# Patient Record
Sex: Female | Born: 1960 | Race: White | Hispanic: No | Smoking: Former smoker
Health system: Southern US, Community
[De-identification: ages and names within clinical notes are randomized; demographics above are authoritative.]

## PROBLEM LIST (undated history)

## (undated) DIAGNOSIS — T7840XA Allergy, unspecified, initial encounter: Secondary | ICD-10-CM

## (undated) DIAGNOSIS — R011 Cardiac murmur, unspecified: Secondary | ICD-10-CM

## (undated) DIAGNOSIS — J479 Bronchiectasis, uncomplicated: Secondary | ICD-10-CM

## (undated) DIAGNOSIS — R079 Chest pain, unspecified: Secondary | ICD-10-CM

## (undated) DIAGNOSIS — M069 Rheumatoid arthritis, unspecified: Secondary | ICD-10-CM

## (undated) DIAGNOSIS — M810 Age-related osteoporosis without current pathological fracture: Secondary | ICD-10-CM

## (undated) DIAGNOSIS — F32A Depression, unspecified: Secondary | ICD-10-CM

## (undated) DIAGNOSIS — O24419 Gestational diabetes mellitus in pregnancy, unspecified control: Secondary | ICD-10-CM

## (undated) DIAGNOSIS — G4730 Sleep apnea, unspecified: Secondary | ICD-10-CM

## (undated) DIAGNOSIS — M549 Dorsalgia, unspecified: Secondary | ICD-10-CM

## (undated) DIAGNOSIS — F329 Major depressive disorder, single episode, unspecified: Secondary | ICD-10-CM

## (undated) DIAGNOSIS — K602 Anal fissure, unspecified: Secondary | ICD-10-CM

## (undated) DIAGNOSIS — J45909 Unspecified asthma, uncomplicated: Secondary | ICD-10-CM

## (undated) DIAGNOSIS — M2550 Pain in unspecified joint: Secondary | ICD-10-CM

## (undated) DIAGNOSIS — F419 Anxiety disorder, unspecified: Secondary | ICD-10-CM

## (undated) DIAGNOSIS — E785 Hyperlipidemia, unspecified: Secondary | ICD-10-CM

## (undated) DIAGNOSIS — E559 Vitamin D deficiency, unspecified: Secondary | ICD-10-CM

## (undated) DIAGNOSIS — M1990 Unspecified osteoarthritis, unspecified site: Secondary | ICD-10-CM

## (undated) DIAGNOSIS — M797 Fibromyalgia: Secondary | ICD-10-CM

## (undated) DIAGNOSIS — R002 Palpitations: Secondary | ICD-10-CM

## (undated) DIAGNOSIS — R0602 Shortness of breath: Secondary | ICD-10-CM

## (undated) DIAGNOSIS — R197 Diarrhea, unspecified: Secondary | ICD-10-CM

## (undated) DIAGNOSIS — A319 Mycobacterial infection, unspecified: Secondary | ICD-10-CM

## (undated) HISTORY — DX: Anxiety disorder, unspecified: F41.9

## (undated) HISTORY — DX: Depression, unspecified: F32.A

## (undated) HISTORY — DX: Diarrhea, unspecified: R19.7

## (undated) HISTORY — DX: Chest pain, unspecified: R07.9

## (undated) HISTORY — DX: Pain in unspecified joint: M25.50

## (undated) HISTORY — DX: Palpitations: R00.2

## (undated) HISTORY — DX: Shortness of breath: R06.02

## (undated) HISTORY — DX: Allergy, unspecified, initial encounter: T78.40XA

## (undated) HISTORY — DX: Major depressive disorder, single episode, unspecified: F32.9

## (undated) HISTORY — DX: Mycobacterial infection, unspecified: A31.9

## (undated) HISTORY — DX: Vitamin D deficiency, unspecified: E55.9

## (undated) HISTORY — DX: Rheumatoid arthritis, unspecified: M06.9

## (undated) HISTORY — DX: Sleep apnea, unspecified: G47.30

## (undated) HISTORY — DX: Fibromyalgia: M79.7

## (undated) HISTORY — DX: Dorsalgia, unspecified: M54.9

## (undated) HISTORY — PX: OTHER SURGICAL HISTORY: SHX169

## (undated) HISTORY — DX: Unspecified asthma, uncomplicated: J45.909

## (undated) HISTORY — DX: Cardiac murmur, unspecified: R01.1

## (undated) HISTORY — DX: Age-related osteoporosis without current pathological fracture: M81.0

## (undated) HISTORY — DX: Bronchiectasis, uncomplicated: J47.9

## (undated) HISTORY — DX: Gestational diabetes mellitus in pregnancy, unspecified control: O24.419

## (undated) HISTORY — DX: Unspecified osteoarthritis, unspecified site: M19.90

## (undated) HISTORY — PX: COLONOSCOPY: SHX174

## (undated) HISTORY — DX: Anal fissure, unspecified: K60.2

## (undated) HISTORY — DX: Hyperlipidemia, unspecified: E78.5

---

## 1998-03-31 ENCOUNTER — Ambulatory Visit (HOSPITAL_COMMUNITY): Admission: RE | Admit: 1998-03-31 | Discharge: 1998-03-31 | Payer: Self-pay | Admitting: Surgery

## 1998-09-06 HISTORY — PX: FOOT SURGERY: SHX648

## 1998-09-06 HISTORY — PX: OTHER SURGICAL HISTORY: SHX169

## 1998-11-05 ENCOUNTER — Other Ambulatory Visit: Admission: RE | Admit: 1998-11-05 | Discharge: 1998-11-05 | Payer: Self-pay | Admitting: Gynecology

## 2000-09-19 ENCOUNTER — Other Ambulatory Visit: Admission: RE | Admit: 2000-09-19 | Discharge: 2000-09-19 | Payer: Self-pay | Admitting: Gynecology

## 2000-10-19 ENCOUNTER — Encounter: Payer: Self-pay | Admitting: Gynecology

## 2000-10-19 ENCOUNTER — Ambulatory Visit (HOSPITAL_COMMUNITY): Admission: RE | Admit: 2000-10-19 | Discharge: 2000-10-19 | Payer: Self-pay | Admitting: Gynecology

## 2001-10-31 ENCOUNTER — Other Ambulatory Visit: Admission: RE | Admit: 2001-10-31 | Discharge: 2001-10-31 | Payer: Self-pay | Admitting: Gynecology

## 2003-01-09 ENCOUNTER — Other Ambulatory Visit: Admission: RE | Admit: 2003-01-09 | Discharge: 2003-01-09 | Payer: Self-pay | Admitting: Gynecology

## 2005-02-22 ENCOUNTER — Other Ambulatory Visit: Admission: RE | Admit: 2005-02-22 | Discharge: 2005-02-22 | Payer: Self-pay | Admitting: Gynecology

## 2005-05-18 ENCOUNTER — Ambulatory Visit (HOSPITAL_COMMUNITY): Admission: RE | Admit: 2005-05-18 | Discharge: 2005-05-18 | Payer: Self-pay | Admitting: Gynecology

## 2006-03-07 ENCOUNTER — Other Ambulatory Visit: Admission: RE | Admit: 2006-03-07 | Discharge: 2006-03-07 | Payer: Self-pay | Admitting: Gynecology

## 2006-09-06 HISTORY — PX: LUMBAR DISC SURGERY: SHX700

## 2006-09-18 ENCOUNTER — Encounter: Admission: RE | Admit: 2006-09-18 | Discharge: 2006-09-18 | Payer: Self-pay | Admitting: Rheumatology

## 2006-10-20 ENCOUNTER — Ambulatory Visit (HOSPITAL_COMMUNITY): Admission: RE | Admit: 2006-10-20 | Discharge: 2006-10-21 | Payer: Self-pay | Admitting: Neurosurgery

## 2007-11-13 ENCOUNTER — Other Ambulatory Visit: Admission: RE | Admit: 2007-11-13 | Discharge: 2007-11-13 | Payer: Self-pay | Admitting: Gynecology

## 2008-05-15 ENCOUNTER — Encounter: Admission: RE | Admit: 2008-05-15 | Discharge: 2008-05-15 | Payer: Self-pay | Admitting: Gynecology

## 2010-09-27 ENCOUNTER — Encounter: Payer: Self-pay | Admitting: Gynecology

## 2011-01-22 NOTE — Op Note (Signed)
NAMEEILENE, VOIGT                 ACCOUNT NO.:  [id number]   MEDICAL RECORD NO.:  [id number]          PATIENT TYPE:  AMB   LOCATION:  SDS                          FACILITY:  [facility]   PHYSICIAN:  Clydene Fake, M.D.  DATE OF BIRTH:  13-Aug-1961   DATE OF PROCEDURE:  10/20/2006  DATE OF DISCHARGE:                               OPERATIVE REPORT   PREOPERATIVE DIAGNOSIS:  Left L4-5 herniated nucleus pulposus and far-  lateral herniated nucleus pulposus.   POSTOPERATIVE DIAGNOSES:  Left L4-5 herniated nucleus pulposus and far-  lateral herniated nucleus pulposus.   PROCEDURE:  Left L4-5 semihemilaminectomy, diskectomy and far-lateral  diskectomy, microdissection with the microscope.   SURGEON:  Clydene Fake, M.D.   ASSISTANT:  Hewitt Shorts, M.D.   ANESTHESIA:  General endotracheal tube anesthesia.   ESTIMATED BLOOD LOSS:  Minimal.   BLOOD GIVEN:  None.   DRAINS:  None.   COMPLICATIONS:  None.   REASON FOR PROCEDURE:  The patient is a 50 year old woman, who for the  last 6 or 7 weeks has had pain from the left buttock down the left leg  to the ankle and top of the foot with numbness.  Steroid Dosepak did not  help much.  Diclofenac has not helped much.  Exam shows left EHL 3 to 5  in strength, dorsiflexion, and there was intact sensation, and it was  decreased in the left L5 and the left L4 distribution.  MRI was done,  showing a large disk herniation at L4-5 with a fragment going under the  5 root, and also a foraminal and far lateral component compressing the 4  root.  The patient was brought in for diskectomy.   PROCEDURE IN DETAIL:  The patient was brought to the operating room.  General anesthesia was induced.  The patient was placed in a prone  position on the Wilson frame with all pressure points padded.  The  patient was prepped and draped in sterile fashion.  The site of the  incision was injected with 10 mL of 1% lidocaine with epinephrine.  A  needle  was placed in the interspace.  X-ray was obtained, showing the  needle was pointed at the 4-5 interspace.  Incision was then made,  centered where the needle was.  Incision taken down to the fascia and  hemostasis was obtained with Bovie cauterization.  The fascia was  incised on the left side over the L4 and 5 spinous processes and  subperiosteal dissection was done over the spinous processes and the  laminae out to the facets.  Self-retaining retractor was placed.  Marker  was placed in the interspace.  Another x-ray was obtained, confirming  our positioning at L4-5.  The microscope was brought in for  microdissection, and at this point, a high-speed drill was used to start  a semihemilaminectomy at L4-5, removing the inferior part of L4, the  superior part of L5.  Medial facetectomy.  Kerrison punch was used to  continue the laminectomy and remove the ligamentum flavum, and a  foraminotomy was done over the L4  roots and we made our dissection was  up just above the disk space, cephalad.  We explored the epidural space.  There was a large free fragment of disk under the 5 root, and we removed  this, and there was a long, 3 cm or so, piece of disk that was up under  the 5 root, and when we removed that, we had good decompression of the 5  root.  We explored the annulus.  It had a broad disk bulging and a large  rent into the disk space.  The 4-5 disk space was then incised with a 15  blade and diskectomy continued with pituitary rongeurs and curets.  We  reached up in the far lateral space with curets and pushed disk material  back down into the disk space and removed it with Kerrison punches and  pituitary rongeurs to decompress the 4 root, and we did a far lateral  diskectomy through this root.  When we were finished, we had good  decompression of the thecal sac, the 5 and the L4 roots.  They went out  the foramina well without any compression.  We irrigated with antibiotic  solution.   Hemostasis was obtained with bipolar cauterization, Gelfoam  and thrombin.  The Gelfoam was irrigated out.  We irrigated with  antibiotic solution some more.  We had good hemostasis.  The retractors  were removed.  The fascia was closed with 0 Vicryl interrupted sutures.  Subcutaneous tissues were closed with 0, 2-0 and 3-0 Vicryl interrupted  sutures.  Skin was closed with benzoin and Steri-Strips.  Dressing was  placed.  The patient was placed back in supine position, awakened from  anesthesia and transferred to the recovery room in stable condition.           ______________________________  Clydene Fake, M.D.     JRH/MEDQ  D:  10/20/2006  T:  10/21/2006  Job:  [id number]

## 2016-10-22 ENCOUNTER — Ambulatory Visit (INDEPENDENT_AMBULATORY_CARE_PROVIDER_SITE_OTHER): Payer: BC Managed Care – PPO | Admitting: Family Medicine

## 2016-10-22 VITALS — BP 106/70 | HR 98 | Temp 99.3°F | Resp 16 | Ht 63.0 in | Wt 156.6 lb

## 2016-10-22 DIAGNOSIS — J329 Chronic sinusitis, unspecified: Secondary | ICD-10-CM

## 2016-10-22 DIAGNOSIS — J3089 Other allergic rhinitis: Secondary | ICD-10-CM

## 2016-10-22 DIAGNOSIS — R6889 Other general symptoms and signs: Secondary | ICD-10-CM | POA: Diagnosis not present

## 2016-10-22 LAB — POCT INFLUENZA A/B
Influenza A, POC: NEGATIVE
Influenza B, POC: NEGATIVE

## 2016-10-22 MED ORDER — IPRATROPIUM BROMIDE 0.03 % NA SOLN: 2.0000 | Freq: Two times a day (BID) | NASAL | 0 refills | Status: DC

## 2016-10-22 MED ORDER — AMOXICILLIN-POT CLAVULANATE 875-125 MG PO TABS: 1.0000 | ORAL_TABLET | Freq: Two times a day (BID) | ORAL | 0 refills | Status: DC

## 2016-10-22 MED ORDER — FLUCONAZOLE 150 MG PO TABS: 150.0000 mg | ORAL_TABLET | Freq: Once | ORAL | 0 refills | Status: AC

## 2016-10-22 MED ORDER — HYDROCOD POLST-CPM POLST ER 10-8 MG/5ML PO SUER
5.0000 mL | Freq: Two times a day (BID) | ORAL | 0 refills | Status: DC | PRN
Start: 2016-10-22 — End: 2018-01-13

## 2016-10-22 MED ORDER — ALBUTEROL SULFATE HFA 108 (90 BASE) MCG/ACT IN AERS: 2.0000 | INHALATION_SPRAY | Freq: Four times a day (QID) | RESPIRATORY_TRACT | 2 refills | Status: DC | PRN

## 2016-10-22 NOTE — Progress Notes (Signed)
Patient ID: Karina Wilkinson, female    DOB: June 03, 1961, 56 y.o.   MRN: [id number]  PCP: No PCP Per Patient  Chief Complaint  Patient presents with  . Nasal Congestion    Symptoms x 2 days  . Cough  . Chest Pain    w/ cough   . Sore Throat  . Dizziness    Subjective:  HPI  56 year old female presents for evaluation of nasal congestion, cough, chest tenderness with cough, dizziness, and sore throat x 2 days. Illness started with cough. Illness has since progressed to itching of bilateral ears and throat. Nasal drainage, headache with facial pain, with dizziness that started today. Reports general body aches. Unknown if recent exposure to influenza. Headache mostly generalized and radiate to her upper neck. Reports that she has chronic persistent outdoor allergies which she previously has been under the care of an allergist. She is new to the area and requests a referral to establish care with an allergist.   Social History   Social History  . Marital status: Legally Separated    Spouse name: N/A  . Number of children: N/A  . Years of education: N/A   Occupational History  . Not on file.   Social History Main Topics  . Smoking status: Never Smoker  . Smokeless tobacco: Never Used  . Alcohol use 1.8 oz/week    3 Glasses of wine per week  . Drug use: No  . Sexual activity: Not on file   Other Topics Concern  . Not on file   Social History Narrative  . No narrative on file    Family History  Problem Relation Age of Onset  . Hypertension Mother   . Hypertension Father   . Cancer Father   . Heart disease Father   . Hyperlipidemia Father   . Stroke Father   . Mental illness Maternal Grandmother   . Stroke Maternal Grandmother   . Hypertension Maternal Grandmother   . Cancer Paternal Grandmother   . Diabetes Paternal Grandmother    Review of Systems See HPI   Prior to Admission medications   Medication Sig Start Date End Date Taking? Authorizing Provider    albuterol (PROAIR HFA) 108 (90 Base) MCG/ACT inhaler Inhale into the lungs every 6 (six) hours as needed for wheezing or shortness of breath.   Yes Historical Provider, MD  busPIRone (BUSPAR) 10 MG tablet Take 10 mg by mouth 3 (three) times daily.   Yes Historical Provider, MD  citalopram (CELEXA) 20 MG tablet Take 60 mg by mouth daily.   Yes Historical Provider, MD  Liraglutide -Weight Management (SAXENDA) 18 MG/3ML SOPN Inject into the skin.   Yes Historical Provider, MD    Past Medical, Surgical Family and Social History reviewed and updated.    Objective:   Today's Vitals   10/22/16 1027  BP: 106/70  Pulse: 98  Resp: 16  Temp: 99.3 F (37.4 C)  TempSrc: Oral  SpO2: 100%  Weight: 156 lb 9.6 oz (71 kg)  Height: 5\' 3"  (1.6 m)    Wt Readings from Last 3 Encounters:  10/22/16 156 lb 9.6 oz (71 kg)   Physical Exam  Constitutional: She is oriented to person, place, and time. She appears well-developed and well-nourished. She has a sickly appearance.  HENT:  Head: Normocephalic and atraumatic.  Right Ear: A middle ear effusion is present.  Left Ear: A middle ear effusion is present.  Nose: Mucosal edema and rhinorrhea present.  Mouth/Throat:  Oropharynx is clear and moist and mucous membranes are normal.  Eyes: Conjunctivae are normal. Pupils are equal, round, and reactive to light.  Neck: Normal range of motion. Neck supple.  Cardiovascular: Normal rate, regular rhythm, normal heart sounds and intact distal pulses.   Pulmonary/Chest: Effort normal. She has no wheezes. She exhibits tenderness.  Musculoskeletal: Normal range of motion.  Lymphadenopathy:    She has no cervical adenopathy.  Neurological: She is alert and oriented to person, place, and time.  Skin: Skin is warm and dry.  Psychiatric: She has a normal mood and affect. Her behavior is normal. Judgment and thought content normal.      Assessment & Plan:  1. Flu-like symptoms 2. Sinusitis, unspecified  chronic 3. Environmental and seasonal allergies  I have placed a referral to for you to be evaluated by an allergist.  For cough, chlorpheniramine-hydrocodone (Tussionex) 5 ml every 12 hours.  Medication causes drowsiness and or sedation.   Start Augmentin 1 tablet twice daily with food to avoid stomach upset x 10 days. Complete all medication.   Ipratropium (Atrovent) 2 sprays, twice daily as needed for nasal congestion.   Carroll Sage. Kenton Kingfisher, MSN, FNP-C [facility]

## 2016-10-22 NOTE — Patient Instructions (Addendum)
-I have placed a referral to for you to be evaluated by an allergist.  For cough, For cough, chlorpheniramine-hydrocodone (Tussionex) 5 ml every 12 hours. Medication causes drowsiness and or sedation.   Start Augmentin 1 tablet twice daily with food to avoid stomach upset x 10 days. Complete all medication.   Ipratropium (Atrovent) 2 sprays, twice daily as needed for nasal congestion.   IF you received an x-ray today, you will receive an invoice from [facility]. Please contact [facility] at [phone] with questions or concerns regarding your invoice.   IF you received labwork today, you will receive an invoice from [place]. Please contact [facility] at [phone] with questions or concerns regarding your invoice.   Our billing staff will not be able to assist you with questions regarding bills from these companies.  You will be contacted with the lab results as soon as they are available. The fastest way to get your results is to activate your My Chart account. Instructions are located on the last page of this paperwork. If you have not heard from [place] regarding the results in 2 weeks, please contact this office.      Sinusitis, Adult Sinusitis is soreness and inflammation of your sinuses. Sinuses are hollow spaces in the bones around your face. They are located:  Around your eyes.  In the middle of your forehead.  Behind your nose.  In your cheekbones. Your sinuses and nasal passages are lined with a stringy fluid (mucus). Mucus normally drains out of your sinuses. When your nasal tissues get inflamed or swollen, the mucus can get trapped or blocked so air cannot flow through your sinuses. This lets bacteria, viruses, and funguses grow, and that leads to infection. Follow these instructions at home: Medicines  Take, use, or apply over-the-counter and prescription medicines only as told by your doctor. These may include nasal sprays.  If you were prescribed  an antibiotic medicine, take it as told by your doctor. Do not stop taking the antibiotic even if you start to feel better. Hydrate and Humidify  Drink enough water to keep your pee (urine) clear or pale yellow.  Use a cool mist humidifier to keep the humidity level in your home above 50%.  Breathe in steam for 10-15 minutes, 3-4 times a day or as told by your doctor. You can do this in the bathroom while a hot shower is running.  Try not to spend time in cool or dry air. Rest  Rest as much as possible.  Sleep with your head raised (elevated).  Make sure to get enough sleep each night. General instructions  Put a warm, moist washcloth on your face 3-4 times a day or as told by your doctor. This will help with discomfort.  Wash your hands often with soap and water. If there is no soap and water, use hand sanitizer.  Do not smoke. Avoid being around people who are smoking (secondhand smoke).  Keep all follow-up visits as told by your doctor. This is important. Contact a doctor if:  You have a fever.  Your symptoms get worse.  Your symptoms do not get better within 10 days. Get help right away if:  You have a very bad headache.  You cannot stop throwing up (vomiting).  You have pain or swelling around your face or eyes.  You have trouble seeing.  You feel confused.  Your neck is stiff.  You have trouble breathing. This information is not intended to replace advice given to  you by your health care provider. Make sure you discuss any questions you have with your health care provider. Document Released: 02/09/2008 Document Revised: 04/18/2016 Document Reviewed: 06/18/2015 Elsevier Interactive Patient Education  2017 [employer].

## 2017-12-07 ENCOUNTER — Encounter: Payer: Self-pay | Admitting: Internal Medicine

## 2018-01-12 ENCOUNTER — Ambulatory Visit: Payer: Self-pay | Admitting: Internal Medicine

## 2018-01-13 ENCOUNTER — Ambulatory Visit: Payer: BC Managed Care – PPO | Admitting: Gastroenterology

## 2018-01-13 ENCOUNTER — Encounter: Payer: Self-pay | Admitting: Gastroenterology

## 2018-01-13 ENCOUNTER — Telehealth: Payer: Self-pay

## 2018-01-13 ENCOUNTER — Encounter

## 2018-01-13 ENCOUNTER — Other Ambulatory Visit (INDEPENDENT_AMBULATORY_CARE_PROVIDER_SITE_OTHER): Payer: BC Managed Care – PPO

## 2018-01-13 VITALS — BP 106/74 | HR 76 | Ht 62.25 in | Wt 158.0 lb

## 2018-01-13 DIAGNOSIS — K529 Noninfective gastroenteritis and colitis, unspecified: Secondary | ICD-10-CM

## 2018-01-13 DIAGNOSIS — R159 Full incontinence of feces: Secondary | ICD-10-CM | POA: Diagnosis not present

## 2018-01-13 DIAGNOSIS — Z1211 Encounter for screening for malignant neoplasm of colon: Secondary | ICD-10-CM | POA: Diagnosis not present

## 2018-01-13 LAB — TSH: TSH: 1.02 u[IU]/mL (ref 0.35–4.50)

## 2018-01-13 LAB — IGA: IGA: 269 mg/dL (ref 68–378)

## 2018-01-13 MED ORDER — PEG-KCL-NACL-NASULF-NA ASC-C 140 G PO SOLR: 1.0000 | Freq: Once | ORAL | 0 refills | Status: AC

## 2018-01-13 NOTE — Progress Notes (Signed)
HPI :  57 y/o female with a history of arthritis, hyperlipidemia, anxiety, self referred here for a new patient visit to discuss diarrhea / changes in bowels.  She reports she is had altered bowel habits intermittently over the past few years. On a good day she will have 3-4 mostly formed bowel movements per day. On a bad day she can have upwards of 10 loose and watery stools per day with urgency. She has been suffering from occasional incontinence during the night and both during the day. She has such strong urgency she has a hard time getting to the bathroom in time. She denies any blood in her stools at this time. She has a history of remote anal fissure. She denies much of any significant routine abdominal pains that bother her. She denies any reflux or dyspepsia. No dysphagia. No nausea or vomiting, she is otherwise eating well. She denies any routine NSAID use. She denies any new medications. She has a daughter who has a diagnosis of ulcerative colitis. Her sister has irritable bowel syndrome. She takes Imodium as needed for symptoms which does seem to help slow her down. She's never had a prior colonoscopy. She denies any family history of colon cancer.   Past Medical History:  Diagnosis Date  . Allergy   . Anal fissure   . Anxiety   . Arthritis   . Asthma   . Depression   . Fibromyalgia   . Gestational diabetes   . Heart murmur   . HLD (hyperlipidemia)      Past Surgical History:  Procedure Laterality Date  . FOOT SURGERY Right 2000  . low back surgery    . [place] SURGERY  2008  . plantar fasciitis surgery     Family History  Problem Relation Age of Onset  . Hypertension Mother   . Peptic Ulcer Mother   . Hypertension Father   . Cancer Father   . Heart disease Father   . Hyperlipidemia Father   . Stroke Father   . Mental illness Maternal Grandmother   . Stroke Maternal Grandmother   . Hypertension Maternal Grandmother   . Diabetes Paternal Grandmother   .  Ovarian cancer Paternal Grandmother   . Irritable bowel syndrome Sister   . Ovarian cancer Paternal Aunt   . Ulcerative colitis Daughter    Social History   Tobacco Use  . Smoking status: Former Smoker    Types: Cigarettes    Last attempt to quit: 1985    Years since quitting: 34.3  . Smokeless tobacco: Never Used  Substance Use Topics  . Alcohol use: Yes    Alcohol/week: 1.8 oz    Types: 3 Glasses of wine per week    Comment: 0-2 per day  . Drug use: No   Current Outpatient Medications  Medication Sig Dispense Refill  . busPIRone (BUSPAR) 10 MG tablet Take 10 mg by mouth 3 (three) times daily.    . citalopram (CELEXA) 20 MG tablet Take 60 mg by mouth daily.    . cyclobenzaprine (FLEXERIL) 10 MG tablet Take 10 mg by mouth as needed for muscle spasms.    . fluticasone (FLONASE) 50 MCG/ACT nasal spray Place 1 spray into both nostrils daily.    . rosuvastatin (CRESTOR) 10 MG tablet Take 10 mg by mouth daily.    . Vitamin D, Ergocalciferol, (DRISDOL) 50000 units CAPS capsule Take 50,000 Units by mouth every 7 (seven) days.    . Liraglutide -Weight Management (SAXENDA) 18  MG/3ML SOPN Inject into the skin.    Marland Kitchen PEG-KCl-NaCl-NaSulf-Na Asc-C (PLENVU) 140 g SOLR Take 1 kit by mouth once for 1 dose. 1 each 0   No current facility-administered medications for this visit.    No Known Allergies   Review of Systems: All systems reviewed and negative except where noted in HPI.   No labs available in Epic  Physical Exam: BP 106/74 (BP Location: Left Arm, Patient Position: Sitting, Cuff Size: Normal)   Pulse 76   Ht 5' 2.25" (1.581 m) Comment: height measured without shoes  Wt 158 lb (71.7 kg)   BMI 28.67 kg/m  Constitutional: Pleasant,well-developed, female in no acute distress. HEENT: Normocephalic and atraumatic. Conjunctivae are normal. No scleral icterus. Neck supple.  Cardiovascular: Normal rate, regular rhythm.  Pulmonary/chest: Effort normal and breath sounds normal. No  wheezing, rales or rhonchi. Abdominal: Soft, nondistended, nontender.  There are no masses palpable. No hepatomegaly. Extremities: no edema Lymphadenopathy: No cervical adenopathy noted. Neurological: Alert and oriented to person place and time. Skin: Skin is warm and dry. No rashes noted. Psychiatric: Normal mood and affect. Behavior is normal.   ASSESSMENT AND PLAN: 57 year old female with medical history as outlined above, presenting with symptoms of chronic diarrhea associated with fecal incontinence. She's not had a prior colonoscopy. Her daughter has ulcerative colitis  We discussed potential causes for her symptoms. Given the chronicity of her symptoms infectious etiologies seem much less likely. As she's never had a colonoscopy before, I'm recommending this to be done not only for colon cancer screening purposes but to ensure no evidence of inflammatory bowel disease or microscopic colitis. I discussed the risks and benefits of colonoscopy with the patient and she wished to proceed. In the interim we will obtain her baseline labs from her primary care physician to ensure okay. I will also send labs today to include TSH and celiac serologies to ensure normal. In the interim recommend that she use Imodium on a scheduled basis, such as taking an Imodium prior to bedtime in attempt to prevent nocturnal symptoms, and then taking again in the morning and titrate up as needed. She agreed with this plan. Further recommendations pending the results.  Menominee Cellar, MD [facility]

## 2018-01-13 NOTE — Telephone Encounter (Signed)
Called pt and LM that she does not need to hold Saxenda daily injections (will be using for weight loss starting sometime soon) for more than one day prior to colonoscopy procedure (per Nicoletta Ba, PA). This medication contains liraglutide, the same ingredient in Victoza used to treat diabetes. She has been placed on the wait list for a sooner date for her procedure.

## 2018-01-13 NOTE — Patient Instructions (Addendum)
If you are age 59 or older, your body mass index should be between 23-30. Your Body mass index is 28.67 kg/m. If this is out of the aforementioned range listed, please consider follow up with your Primary Care Provider.  If you are age 42 or younger, your body mass index should be between 19-25. Your Body mass index is 28.67 kg/m. If this is out of the aformentioned range listed, please consider follow up with your Primary Care Provider.   You have been scheduled for a colonoscopy. Please follow written instructions given to you at your visit today.  Please pick up your prep supplies at the pharmacy within the next 1-3 days. If you use inhalers (even only as needed), please bring them with you on the day of your procedure. Your physician has requested that you go to www.startemmi.com and enter the access code given to you at your visit today. This web site gives a general overview about your procedure. However, you should still follow specific instructions given to you by our office regarding your preparation for the procedure.  Please go to the lab in the basement of our building to have lab work done as you leave today.  Please purchase the following medications over the counter and take as directed: Imodium, take at bedtime as needed  We will request your labs from your Primary Care doctor.  Thank you for entrusting me with your care and for choosing [facility], Dr. Wilton Cellar

## 2018-01-16 LAB — TISSUE TRANSGLUTAMINASE, IGA: (tTG) Ab, IgA: 1 U/mL

## 2018-01-24 ENCOUNTER — Telehealth: Payer: Self-pay | Admitting: Gastroenterology

## 2018-01-24 NOTE — Telephone Encounter (Signed)
Lab results from primary care arrived.  11/04/2017: Normal LFts, BUN 15, Cr 0.77, electrolytes normal  No CBC on file.  Will await colonoscopy.

## 2018-01-30 ENCOUNTER — Encounter: Payer: Self-pay | Admitting: Gastroenterology

## 2018-01-31 ENCOUNTER — Telehealth: Payer: Self-pay | Admitting: Gastroenterology

## 2018-01-31 NOTE — Telephone Encounter (Signed)
PLs disregard this message. Pt called back stating that she already gave pharmacy coupon but she did not remember it.

## 2018-01-31 NOTE — Telephone Encounter (Signed)
Universal Codes sent to [facility] pharmacy for Plenvu.

## 2018-02-07 ENCOUNTER — Other Ambulatory Visit: Payer: Self-pay

## 2018-02-07 ENCOUNTER — Ambulatory Visit (AMBULATORY_SURGERY_CENTER): Payer: BC Managed Care – PPO | Admitting: Gastroenterology

## 2018-02-07 ENCOUNTER — Encounter: Payer: Self-pay | Admitting: Gastroenterology

## 2018-02-07 VITALS — BP 107/62 | HR 57 | Temp 98.2°F | Resp 10 | Ht 62.25 in | Wt 158.0 lb

## 2018-02-07 DIAGNOSIS — D120 Benign neoplasm of cecum: Secondary | ICD-10-CM

## 2018-02-07 DIAGNOSIS — K529 Noninfective gastroenteritis and colitis, unspecified: Secondary | ICD-10-CM

## 2018-02-07 DIAGNOSIS — D126 Benign neoplasm of colon, unspecified: Secondary | ICD-10-CM | POA: Diagnosis not present

## 2018-02-07 DIAGNOSIS — D123 Benign neoplasm of transverse colon: Secondary | ICD-10-CM

## 2018-02-07 MED ORDER — SODIUM CHLORIDE 0.9 % IV SOLN: 500.0000 mL | Freq: Once | INTRAVENOUS | Status: DC

## 2018-02-07 NOTE — Op Note (Signed)
[facility] Patient Name: Karina Wilkinson Procedure Date: 02/07/2018 9:43 AM MRN: [id number] Endoscopist: Remo Lipps P. Havery Moros , MD Age: 57 Referring MD:  Date of Birth: Aug 16, 1961 Gender: Female Account #: [id number] Procedure:                Colonoscopy Indications:              This is the patient's first colonoscopy, Chronic                            diarrhea Medicines:                Monitored Anesthesia Care Procedure:                Pre-Anesthesia Assessment:                           - Prior to the procedure, a History and Physical                            was performed, and patient medications and                            allergies were reviewed. The patient's tolerance of                            previous anesthesia was also reviewed. The risks                            and benefits of the procedure and the sedation                            options and risks were discussed with the patient.                            All questions were answered, and informed consent                            was obtained. Prior Anticoagulants: The patient has                            taken no previous anticoagulant or antiplatelet                            agents. ASA Grade Assessment: II - A patient with                            mild systemic disease. After reviewing the risks                            and benefits, the patient was deemed in                            satisfactory condition to undergo the procedure.  After obtaining informed consent, the colonoscope                            was passed under direct vision. Throughout the                            procedure, the patient's blood pressure, pulse, and                            oxygen saturations were monitored continuously. The                            Colonoscope was introduced through the anus and                            advanced to the the terminal ileum, with                     identification of the appendiceal orifice and IC                            valve. The colonoscopy was performed without                            difficulty. The patient tolerated the procedure                            well. The quality of the bowel preparation was                            good. The terminal ileum, ileocecal valve,                            appendiceal orifice, and rectum were photographed. Scope In: 3:08:65 AM Scope Out: 10:20:39 AM Scope Withdrawal Time: 0 hours 19 minutes 24 seconds  Total Procedure Duration: 0 hours 24 minutes 25 seconds  Findings:                 The perianal and digital rectal examinations were                            normal.                           The terminal ileum appeared normal.                           A 5 to 6 mm polyp was found in the cecum. The polyp                            was sessile. The polyp was removed with a cold                            snare. Resection and retrieval were complete.  A 5 mm polyp was found in the hepatic flexure. The                            polyp was sessile. The polyp was removed with a                            cold snare. Resection and retrieval were complete.                           The colon was tortuous.                           Internal hemorrhoids were found during retroflexion.                           The exam was otherwise without abnormality.                           Biopsies for histology were taken with a cold                            forceps from the right colon, left colon and                            transverse colon for evaluation of microscopic                            colitis. Complications:            No immediate complications. Estimated blood loss:                            Minimal. Estimated Blood Loss:     Estimated blood loss was minimal. Impression:               - The examined portion of the ileum was normal.                            - One 5 to 6 mm polyp in the cecum, removed with a                            cold snare. Resected and retrieved.                           - One 5 mm polyp at the hepatic flexure, removed                            with a cold snare. Resected and retrieved.                           - Tortuous colon.                           - Internal hemorrhoids.                           -  The examination was otherwise normal.                           - Biopsies were taken with a cold forceps from the                            right colon, left colon and transverse colon for                            evaluation of microscopic colitis. Recommendation:           - Patient has a contact number available for                            emergencies. The signs and symptoms of potential                            delayed complications were discussed with the                            patient. Return to normal activities tomorrow.                            Written discharge instructions were provided to the                            patient.                           - Resume previous diet.                           - Continue present medications.                           - Await pathology results.                           - Repeat colonoscopy is recommended for                            surveillance. The colonoscopy date will be                            determined after pathology results from today's                            exam become available for review. Remo Lipps P. Armbruster, MD 02/07/2018 10:25:34 AM This report has been signed electronically.

## 2018-02-07 NOTE — Progress Notes (Signed)
Spontaneous respirations throughout. VSS. Resting comfortably. To PACU on room air. Report to  RN. 

## 2018-02-07 NOTE — Progress Notes (Signed)
Called to room to assist during endoscopic procedure.  Patient ID and intended procedure confirmed with present staff. Received instructions for my participation in the procedure from the performing physician.  

## 2018-02-07 NOTE — Patient Instructions (Signed)
YOU HAD AN ENDOSCOPIC PROCEDURE TODAY AT THE [facility]:   Refer to the procedure report that was given to you for any specific questions about what was found during the examination.  If the procedure report does not answer your questions, please call your gastroenterologist to clarify.  If you requested that your care partner not be given the details of your procedure findings, then the procedure report has been included in a sealed envelope for you to review at your convenience later.  YOU SHOULD EXPECT: Some feelings of bloating in the abdomen. Passage of more gas than usual.  Walking can help get rid of the air that was put into your GI tract during the procedure and reduce the bloating. If you had a lower endoscopy (such as a colonoscopy or flexible sigmoidoscopy) you may notice spotting of blood in your stool or on the toilet paper. If you underwent a bowel prep for your procedure, you may not have a normal bowel movement for a few days.  Please Note:  You might notice some irritation and congestion in your nose or some drainage.  This is from the oxygen used during your procedure.  There is no need for concern and it should clear up in a day or so.  SYMPTOMS TO REPORT IMMEDIATELY:   Following lower endoscopy (colonoscopy or flexible sigmoidoscopy):  Excessive amounts of blood in the stool  Significant tenderness or worsening of abdominal pains  Swelling of the abdomen that is new, acute  Fever of 100F or higher  Please see handouts given to you on Polyps and Hemorrhoids.  For urgent or emergent issues, a gastroenterologist can be reached at any hour by calling [phone].   DIET:  We do recommend a small meal at first, but then you may proceed to your regular diet.  Drink plenty of fluids but you should avoid alcoholic beverages for 24 hours.  ACTIVITY:  You should plan to take it easy for the rest of today and you should NOT DRIVE or use heavy machinery until tomorrow  (because of the sedation medicines used during the test).    FOLLOW UP: Our staff will call the number listed on your records the next business day following your procedure to check on you and address any questions or concerns that you may have regarding the information given to you following your procedure. If we do not reach you, we will leave a message.  However, if you are feeling well and you are not experiencing any problems, there is no need to return our call.  We will assume that you have returned to your regular daily activities without incident.  If any biopsies were taken you will be contacted by phone or by letter within the next 1-3 weeks.  Please call us at [phone] if you have not heard about the biopsies in 3 weeks.    SIGNATURES/CONFIDENTIALITY: You and/or your care partner have signed paperwork which will be entered into your electronic medical record.  These signatures attest to the fact that that the information above on your After Visit Summary has been reviewed and is understood.  Full responsibility of the confidentiality of this discharge information lies with you and/or your care-partner.  Thank you for letting us take care of your healthcare needs today. 

## 2018-02-08 ENCOUNTER — Telehealth: Payer: Self-pay

## 2018-02-08 NOTE — Telephone Encounter (Signed)
  Follow up Call-  Call back number 02/07/2018  Post procedure Call Back phone  # [phone]  Permission to leave phone message Yes  Some recent data might be hidden     Patient questions:  Do you have a fever, pain , or abdominal swelling? No. Pain Score  0 *  Have you tolerated food without any problems? Yes.    Have you been able to return to your normal activities? Yes.    Do you have any questions about your discharge instructions: Diet   No. Medications  No. Follow up visit  No.  Do you have questions or concerns about your Care? No.  Actions: * If pain score is 4 or above: No action needed, pain <4.

## 2018-03-03 ENCOUNTER — Encounter

## 2018-03-08 ENCOUNTER — Encounter: Payer: BC Managed Care – PPO | Admitting: Gastroenterology

## 2018-04-11 ENCOUNTER — Encounter: Payer: Self-pay | Admitting: Gastroenterology

## 2019-11-10 ENCOUNTER — Ambulatory Visit: Payer: BC Managed Care – PPO | Attending: Internal Medicine

## 2019-11-10 DIAGNOSIS — Z23 Encounter for immunization: Secondary | ICD-10-CM | POA: Insufficient documentation

## 2019-11-10 NOTE — Progress Notes (Signed)
   Covid-19 Vaccination Clinic  Name:  Karina Wilkinson    MRN: [id number] DOB: 1961-06-20  11/10/2019  [name] was observed post Covid-19 immunization for 15 minutes without incident. She was provided with Vaccine Information Sheet and instruction to access the V-Safe system.   [name] was instructed to call 911 with any severe reactions post vaccine: Marland Kitchen Difficulty breathing  . Swelling of face and throat  . A fast heartbeat  . A bad rash all over body  . Dizziness and weakness   Immunizations Administered    Name Date Dose VIS Date Route   Pfizer COVID-19 Vaccine 11/10/2019  8:54 AM 0.3 mL 08/17/2019 Intramuscular   Manufacturer: [place]   Lot: [id number]   [place]: [id number]

## 2019-12-01 ENCOUNTER — Ambulatory Visit: Payer: BC Managed Care – PPO | Attending: Internal Medicine

## 2019-12-01 DIAGNOSIS — Z23 Encounter for immunization: Secondary | ICD-10-CM

## 2019-12-01 NOTE — Progress Notes (Signed)
   Covid-19 Vaccination Clinic  Name:  Karina Wilkinson    MRN: [id number] DOB: 04/28/1961  12/01/2019  Karina Wilkinson was observed post Covid-19 immunization for 15 minutes without incident. She was provided with Vaccine Information Sheet and instruction to access the V-Safe system.   Karina Wilkinson was instructed to call 911 with any severe reactions post vaccine: Marland Kitchen Difficulty breathing  . Swelling of face and throat  . A fast heartbeat  . A bad rash all over body  . Dizziness and weakness   Immunizations Administered    Name Date Dose VIS Date Route   Pfizer COVID-19 Vaccine 12/01/2019  8:50 AM 0.3 mL 08/17/2019 Intramuscular   Manufacturer: [place]   Lot: [id number]   [place]: [id number]

## 2019-12-03 ENCOUNTER — Ambulatory Visit: Payer: BC Managed Care – PPO

## 2019-12-11 ENCOUNTER — Ambulatory Visit: Payer: BC Managed Care – PPO

## 2020-06-13 ENCOUNTER — Ambulatory Visit: Payer: BC Managed Care – PPO | Attending: Internal Medicine

## 2020-06-13 DIAGNOSIS — Z23 Encounter for immunization: Secondary | ICD-10-CM

## 2020-06-13 NOTE — Progress Notes (Signed)
   Covid-19 Vaccination Clinic  Name:  Karina Wilkinson    MRN: [id number] DOB: July 27, 1961  06/13/2020  [name] was observed post Covid-19 immunization for 15 minutes without incident. She was provided with Vaccine Information Sheet and instruction to access the V-Safe system.   [name] was instructed to call 911 with any severe reactions post vaccine: Marland Kitchen Difficulty breathing  . Swelling of face and throat  . A fast heartbeat  . A bad rash all over body  . Dizziness and weakness

## 2021-01-22 ENCOUNTER — Other Ambulatory Visit (HOSPITAL_BASED_OUTPATIENT_CLINIC_OR_DEPARTMENT_OTHER): Payer: Self-pay

## 2021-01-22 ENCOUNTER — Other Ambulatory Visit: Payer: Self-pay

## 2021-01-22 ENCOUNTER — Ambulatory Visit: Payer: Self-pay | Attending: Internal Medicine

## 2021-01-22 DIAGNOSIS — Z23 Encounter for immunization: Secondary | ICD-10-CM

## 2021-01-22 MED ORDER — PFIZER-BIONT COVID-19 VAC-TRIS 30 MCG/0.3ML IM SUSP
INTRAMUSCULAR | 0 refills | Status: DC
  Filled 2021-01-22: qty 0.3, 1d supply, fill #0

## 2021-01-22 NOTE — Progress Notes (Signed)
   Covid-19 Vaccination Clinic  Name:  Karina Wilkinson    MRN: [id number] DOB: April 17, 1961  01/22/2021  [name] was observed post Covid-19 immunization for 15 minutes without incident. She was provided with Vaccine Information Sheet and instruction to access the V-Safe system.   [name] was instructed to call 911 with any severe reactions post vaccine: Marland Kitchen Difficulty breathing  . Swelling of face and throat  . A fast heartbeat  . A bad rash all over body  . Dizziness and weakness   Immunizations Administered    Name Date Dose VIS Date Route   PFIZER Comrnaty([place] TOP) Covid-19 Vaccine 01/22/2021 10:57 AM 0.3 mL 08/14/2020 Intramuscular   Manufacturer: [place]   Lot: [id number]   [facility]: [phone]

## 2021-02-17 ENCOUNTER — Other Ambulatory Visit: Payer: Self-pay | Admitting: Rheumatology

## 2021-02-17 DIAGNOSIS — M25561 Pain in right knee: Secondary | ICD-10-CM

## 2021-02-19 ENCOUNTER — Ambulatory Visit
Admission: RE | Admit: 2021-02-19 | Discharge: 2021-02-19 | Disposition: A | Discharge status: Home / Self Care | Payer: BC Managed Care – PPO | Source: Ambulatory Visit | Attending: Rheumatology | Admitting: Rheumatology

## 2021-02-19 DIAGNOSIS — M25561 Pain in right knee: Secondary | ICD-10-CM

## 2021-02-25 ENCOUNTER — Other Ambulatory Visit (HOSPITAL_COMMUNITY): Payer: Self-pay

## 2021-03-02 ENCOUNTER — Other Ambulatory Visit (HOSPITAL_COMMUNITY): Payer: Self-pay

## 2021-10-29 ENCOUNTER — Other Ambulatory Visit: Payer: Self-pay

## 2021-10-29 ENCOUNTER — Encounter (HOSPITAL_BASED_OUTPATIENT_CLINIC_OR_DEPARTMENT_OTHER): Payer: Self-pay

## 2021-10-29 ENCOUNTER — Inpatient Hospital Stay (HOSPITAL_BASED_OUTPATIENT_CLINIC_OR_DEPARTMENT_OTHER)
Admission: EM | Admit: 2021-10-29 | Discharge: 2021-11-01 | DRG: 194 | Disposition: A | Discharge status: Home / Self Care | Payer: BC Managed Care – PPO | Attending: Internal Medicine | Admitting: Internal Medicine

## 2021-10-29 ENCOUNTER — Emergency Department (HOSPITAL_BASED_OUTPATIENT_CLINIC_OR_DEPARTMENT_OTHER): Payer: BC Managed Care – PPO | Admitting: Radiology

## 2021-10-29 ENCOUNTER — Emergency Department (HOSPITAL_BASED_OUTPATIENT_CLINIC_OR_DEPARTMENT_OTHER): Payer: BC Managed Care – PPO

## 2021-10-29 DIAGNOSIS — J9601 Acute respiratory failure with hypoxia: Secondary | ICD-10-CM

## 2021-10-29 DIAGNOSIS — Z8041 Family history of malignant neoplasm of ovary: Secondary | ICD-10-CM | POA: Diagnosis not present

## 2021-10-29 DIAGNOSIS — Z87891 Personal history of nicotine dependence: Secondary | ICD-10-CM | POA: Diagnosis not present

## 2021-10-29 DIAGNOSIS — R0609 Other forms of dyspnea: Secondary | ICD-10-CM | POA: Diagnosis not present

## 2021-10-29 DIAGNOSIS — Z833 Family history of diabetes mellitus: Secondary | ICD-10-CM | POA: Diagnosis not present

## 2021-10-29 DIAGNOSIS — R32 Unspecified urinary incontinence: Secondary | ICD-10-CM | POA: Diagnosis present

## 2021-10-29 DIAGNOSIS — E785 Hyperlipidemia, unspecified: Secondary | ICD-10-CM | POA: Diagnosis present

## 2021-10-29 DIAGNOSIS — J45909 Unspecified asthma, uncomplicated: Secondary | ICD-10-CM | POA: Diagnosis present

## 2021-10-29 DIAGNOSIS — E876 Hypokalemia: Secondary | ICD-10-CM | POA: Diagnosis present

## 2021-10-29 DIAGNOSIS — R0902 Hypoxemia: Secondary | ICD-10-CM

## 2021-10-29 DIAGNOSIS — E663 Overweight: Secondary | ICD-10-CM | POA: Diagnosis present

## 2021-10-29 DIAGNOSIS — R9431 Abnormal electrocardiogram [ECG] [EKG]: Secondary | ICD-10-CM | POA: Diagnosis present

## 2021-10-29 DIAGNOSIS — Z83438 Family history of other disorder of lipoprotein metabolism and other lipidemia: Secondary | ICD-10-CM

## 2021-10-29 DIAGNOSIS — J188 Other pneumonia, unspecified organism: Secondary | ICD-10-CM | POA: Diagnosis present

## 2021-10-29 DIAGNOSIS — R042 Hemoptysis: Secondary | ICD-10-CM | POA: Diagnosis present

## 2021-10-29 DIAGNOSIS — M797 Fibromyalgia: Secondary | ICD-10-CM | POA: Diagnosis present

## 2021-10-29 DIAGNOSIS — Z7952 Long term (current) use of systemic steroids: Secondary | ICD-10-CM | POA: Diagnosis not present

## 2021-10-29 DIAGNOSIS — Z823 Family history of stroke: Secondary | ICD-10-CM | POA: Diagnosis not present

## 2021-10-29 DIAGNOSIS — F419 Anxiety disorder, unspecified: Secondary | ICD-10-CM | POA: Diagnosis present

## 2021-10-29 DIAGNOSIS — Z8616 Personal history of COVID-19: Secondary | ICD-10-CM | POA: Diagnosis not present

## 2021-10-29 DIAGNOSIS — D72828 Other elevated white blood cell count: Secondary | ICD-10-CM | POA: Diagnosis present

## 2021-10-29 DIAGNOSIS — Z79899 Other long term (current) drug therapy: Secondary | ICD-10-CM

## 2021-10-29 DIAGNOSIS — Z6829 Body mass index (BMI) 29.0-29.9, adult: Secondary | ICD-10-CM

## 2021-10-29 DIAGNOSIS — F32A Depression, unspecified: Secondary | ICD-10-CM | POA: Diagnosis present

## 2021-10-29 DIAGNOSIS — J189 Pneumonia, unspecified organism: Principal | ICD-10-CM | POA: Diagnosis present

## 2021-10-29 DIAGNOSIS — Z8249 Family history of ischemic heart disease and other diseases of the circulatory system: Secondary | ICD-10-CM

## 2021-10-29 HISTORY — DX: Overweight: E66.3

## 2021-10-29 LAB — BASIC METABOLIC PANEL
Anion gap: 12 (ref 5–15)
BUN: 11 mg/dL (ref 8–23)
CO2: 27 mmol/L (ref 22–32)
Calcium: 9 mg/dL (ref 8.9–10.3)
Chloride: 105 mmol/L (ref 98–111)
Creatinine, Ser: 0.71 mg/dL (ref 0.44–1.00)
GFR, Estimated: >60 mL/min (ref >60)
Glucose, Bld: 84 mg/dL (ref 70–99)
Potassium: 3.1 mmol/L — ABNORMAL LOW (ref 3.5–5.1)
Sodium: 144 mmol/L (ref 135–145)

## 2021-10-29 LAB — CBC WITH DIFFERENTIAL/PLATELET
Abs Immature Granulocytes: 0.52 10*3/uL — ABNORMAL HIGH (ref 0.00–0.07)
Basophils Absolute: 0.1 10*3/uL (ref 0.0–0.1)
Basophils Relative: 1 %
Eosinophils Absolute: 0.1 10*3/uL (ref 0.0–0.5)
Eosinophils Relative: 0 %
HCT: 33.9 % — ABNORMAL LOW (ref 36.0–46.0)
Hemoglobin: 10.9 g/dL — ABNORMAL LOW (ref 12.0–15.0)
Immature Granulocytes: 3 %
Lymphocytes Relative: 14 %
Lymphs Abs: 2.4 10*3/uL (ref 0.7–4.0)
MCH: 25.8 pg — ABNORMAL LOW (ref 26.0–34.0)
MCHC: 32.2 g/dL (ref 30.0–36.0)
MCV: 80.3 fL (ref 80.0–100.0)
Monocytes Absolute: 1.5 10*3/uL — ABNORMAL HIGH (ref 0.1–1.0)
Monocytes Relative: 9 %
Neutro Abs: 12 10*3/uL — ABNORMAL HIGH (ref 1.7–7.7)
Neutrophils Relative %: 73 %
Platelets: 492 10*3/uL — ABNORMAL HIGH (ref 150–400)
RBC: 4.22 MIL/uL (ref 3.87–5.11)
RDW: 14.6 % (ref 11.5–15.5)
WBC: 16.6 10*3/uL — ABNORMAL HIGH (ref 4.0–10.5)
nRBC: 0 % (ref 0.0–0.2)

## 2021-10-29 LAB — RESP PANEL BY RT-PCR (FLU A&B, COVID) ARPGX2
Influenza A by PCR: NEGATIVE
Influenza B by PCR: NEGATIVE
SARS Coronavirus 2 by RT PCR: NEGATIVE

## 2021-10-29 LAB — HEPATIC FUNCTION PANEL
ALT: 39 U/L (ref 0–44)
AST: 43 U/L — ABNORMAL HIGH (ref 15–41)
Albumin: 3.4 g/dL — ABNORMAL LOW (ref 3.5–5.0)
Alkaline Phosphatase: 98 U/L (ref 38–126)
Bilirubin, Direct: 0.1 mg/dL (ref 0.0–0.2)
Indirect Bilirubin: 0.2 mg/dL — ABNORMAL LOW (ref 0.3–0.9)
Total Bilirubin: 0.3 mg/dL (ref 0.3–1.2)
Total Protein: 7.1 g/dL (ref 6.5–8.1)

## 2021-10-29 LAB — URINALYSIS, ROUTINE W REFLEX MICROSCOPIC
Bilirubin Urine: NEGATIVE
Glucose, UA: NEGATIVE mg/dL
Hgb urine dipstick: NEGATIVE
Ketones, ur: NEGATIVE mg/dL
Leukocytes,Ua: NEGATIVE
Nitrite: NEGATIVE
Protein, ur: NEGATIVE mg/dL
Specific Gravity, Urine: 1.005 (ref 1.005–1.030)
pH: 6 (ref 5.0–8.0)

## 2021-10-29 LAB — BRAIN NATRIURETIC PEPTIDE: B Natriuretic Peptide: 478 pg/mL — ABNORMAL HIGH (ref 0.0–100.0)

## 2021-10-29 LAB — PHOSPHORUS: Phosphorus: 3.4 mg/dL (ref 2.5–4.6)

## 2021-10-29 LAB — PROTIME-INR
INR: 1 (ref 0.8–1.2)
Prothrombin Time: 13.3 seconds (ref 11.4–15.2)

## 2021-10-29 LAB — APTT: aPTT: 29 seconds (ref 24–36)

## 2021-10-29 LAB — MAGNESIUM: Magnesium: 2.2 mg/dL (ref 1.7–2.4)

## 2021-10-29 LAB — LACTIC ACID, PLASMA
Lactic Acid, Venous: 1.6 mmol/L (ref 0.5–1.9)
Lactic Acid, Venous: 1.2 mmol/L (ref 0.5–1.9)

## 2021-10-29 MED ORDER — PROCHLORPERAZINE EDISYLATE 10 MG/2ML IJ SOLN
5.0000 mg | Freq: Once | INTRAMUSCULAR | Status: AC
  Administered 2021-10-29: 5 mg via INTRAVENOUS
  Filled 2021-10-29: qty 2

## 2021-10-29 MED ORDER — SODIUM CHLORIDE 0.9 % IV SOLN
500.0000 mg | INTRAVENOUS | Status: DC
  Administered 2021-10-29: 500 mg via INTRAVENOUS
  Filled 2021-10-29: qty 5

## 2021-10-29 MED ORDER — HYDROCODONE-ACETAMINOPHEN 7.5-325 MG/15ML PO SOLN
10.0000 mL | Freq: Once | ORAL | Status: AC
  Administered 2021-10-29: 10 mL via ORAL
  Filled 2021-10-29: qty 15

## 2021-10-29 MED ORDER — KETOROLAC TROMETHAMINE 30 MG/ML IJ SOLN
30.0000 mg | Freq: Once | INTRAMUSCULAR | Status: AC
Start: 2021-10-29 — End: 2021-10-29
  Administered 2021-10-29: 30 mg via INTRAVENOUS
  Filled 2021-10-29: qty 1

## 2021-10-29 MED ORDER — HYDROMORPHONE HCL 1 MG/ML IJ SOLN: 1.0000 mg | INTRAMUSCULAR | Status: DC | PRN

## 2021-10-29 MED ORDER — LACTATED RINGERS IV BOLUS (SEPSIS): 250.0000 mL | Freq: Once | INTRAVENOUS | Status: DC

## 2021-10-29 MED ORDER — BUPROPION HCL ER (XL) 150 MG PO TB24
150.0000 mg | ORAL_TABLET | Freq: Every morning | ORAL | Status: DC
Start: 2021-10-30 — End: 2021-11-01
  Administered 2021-10-30 – 2021-11-01 (×3): 150 mg via ORAL
  Filled 2021-10-29 (×4): qty 1

## 2021-10-29 MED ORDER — LACTATED RINGERS IV BOLUS (SEPSIS): 1000.0000 mL | Freq: Once | INTRAVENOUS | Status: DC

## 2021-10-29 MED ORDER — ACETAMINOPHEN 325 MG PO TABS
650.0000 mg | ORAL_TABLET | Freq: Four times a day (QID) | ORAL | Status: DC | PRN
  Administered 2021-10-29: 650 mg via ORAL
  Filled 2021-10-29: qty 2

## 2021-10-29 MED ORDER — PANTOPRAZOLE SODIUM 40 MG PO TBEC
40.0000 mg | DELAYED_RELEASE_TABLET | Freq: Every day | ORAL | Status: DC
  Administered 2021-10-29 – 2021-11-01 (×4): 40 mg via ORAL
  Filled 2021-10-29 (×4): qty 1

## 2021-10-29 MED ORDER — HYDROXYCHLOROQUINE SULFATE 200 MG PO TABS
400.0000 mg | ORAL_TABLET | Freq: Every day | ORAL | Status: DC
  Administered 2021-10-29 – 2021-11-01 (×4): 400 mg via ORAL
  Filled 2021-10-29 (×4): qty 2

## 2021-10-29 MED ORDER — LACTATED RINGERS IV SOLN: INTRAVENOUS | Status: DC

## 2021-10-29 MED ORDER — BENZONATATE 100 MG PO CAPS
100.0000 mg | ORAL_CAPSULE | Freq: Once | ORAL | Status: AC
  Administered 2021-10-29: 100 mg via ORAL
  Filled 2021-10-29: qty 1

## 2021-10-29 MED ORDER — HYDROCODONE BIT-HOMATROP MBR 5-1.5 MG/5ML PO SOLN
5.0000 mL | ORAL | Status: DC | PRN
  Administered 2021-10-30 – 2021-11-01 (×7): 5 mL via ORAL
  Filled 2021-10-29 (×8): qty 5

## 2021-10-29 MED ORDER — LACTATED RINGERS IV BOLUS: 1000.0000 mL | Freq: Once | INTRAVENOUS | Status: DC

## 2021-10-29 MED ORDER — POTASSIUM CHLORIDE CRYS ER 20 MEQ PO TBCR
40.0000 meq | EXTENDED_RELEASE_TABLET | Freq: Once | ORAL | Status: AC
  Administered 2021-10-29: 40 meq via ORAL
  Filled 2021-10-29: qty 2

## 2021-10-29 MED ORDER — BUSPIRONE HCL 5 MG PO TABS: 10.0000 mg | ORAL_TABLET | Freq: Three times a day (TID) | ORAL | Status: DC

## 2021-10-29 MED ORDER — HYDROCODONE BIT-HOMATROP MBR 5-1.5 MG/5ML PO SOLN
5.0000 mL | Freq: Four times a day (QID) | ORAL | Status: DC | PRN
  Administered 2021-10-29: 5 mL via ORAL
  Filled 2021-10-29: qty 5

## 2021-10-29 MED ORDER — SODIUM CHLORIDE 0.9 % IV SOLN
2.0000 g | INTRAVENOUS | Status: DC
  Administered 2021-10-29 – 2021-11-01 (×4): 2 g via INTRAVENOUS
  Filled 2021-10-29 (×5): qty 20

## 2021-10-29 MED ORDER — SODIUM CHLORIDE 0.9 % IV SOLN
100.0000 mg | Freq: Two times a day (BID) | INTRAVENOUS | Status: DC
  Administered 2021-10-30: 100 mg via INTRAVENOUS
  Filled 2021-10-29 (×2): qty 100

## 2021-10-29 MED ORDER — ACETAMINOPHEN 500 MG PO TABS: 1000.0000 mg | ORAL_TABLET | Freq: Once | ORAL | Status: DC

## 2021-10-29 MED ORDER — MAGNESIUM SULFATE 2 GM/50ML IV SOLN
2.0000 g | Freq: Once | INTRAVENOUS | Status: AC
  Administered 2021-10-29: 2 g via INTRAVENOUS
  Filled 2021-10-29: qty 50

## 2021-10-29 MED ORDER — METHYLPREDNISOLONE SODIUM SUCC 125 MG IJ SOLR
125.0000 mg | Freq: Once | INTRAMUSCULAR | Status: AC
  Administered 2021-10-29: 125 mg via INTRAVENOUS
  Filled 2021-10-29: qty 2

## 2021-10-29 MED ORDER — CITALOPRAM HYDROBROMIDE 20 MG PO TABS: 60.0000 mg | ORAL_TABLET | Freq: Every day | ORAL | Status: DC

## 2021-10-29 MED ORDER — ROSUVASTATIN CALCIUM 10 MG PO TABS
10.0000 mg | ORAL_TABLET | Freq: Every day | ORAL | Status: DC
  Administered 2021-10-29 – 2021-10-31 (×3): 10 mg via ORAL
  Filled 2021-10-29 (×4): qty 1

## 2021-10-29 MED ORDER — BUSPIRONE HCL 5 MG PO TABS
10.0000 mg | ORAL_TABLET | Freq: Two times a day (BID) | ORAL | Status: DC
Start: 2021-10-29 — End: 2021-11-01
  Administered 2021-10-29 – 2021-11-01 (×5): 10 mg via ORAL
  Filled 2021-10-29 (×6): qty 2

## 2021-10-29 MED ORDER — HYDROMORPHONE HCL 1 MG/ML IJ SOLN
1.0000 mg | Freq: Once | INTRAMUSCULAR | Status: AC
  Administered 2021-10-29: 1 mg via INTRAVENOUS
  Filled 2021-10-29: qty 1

## 2021-10-29 MED ORDER — ALBUTEROL SULFATE (2.5 MG/3ML) 0.083% IN NEBU: 2.5000 mg | INHALATION_SOLUTION | RESPIRATORY_TRACT | Status: DC | PRN

## 2021-10-29 MED ORDER — POTASSIUM CHLORIDE 10 MEQ/100ML IV SOLN
10.0000 meq | Freq: Once | INTRAVENOUS | Status: AC
Start: 2021-10-29 — End: 2021-10-29
  Administered 2021-10-29: 10 meq via INTRAVENOUS
  Filled 2021-10-29: qty 100

## 2021-10-29 MED ORDER — POTASSIUM CHLORIDE IN NACL 20-0.9 MEQ/L-% IV SOLN
INTRAVENOUS | Status: DC
  Filled 2021-10-29: qty 1000

## 2021-10-29 MED ORDER — PROCHLORPERAZINE EDISYLATE 10 MG/2ML IJ SOLN
5.0000 mg | INTRAMUSCULAR | Status: DC | PRN
Start: 2021-10-29 — End: 2021-11-01

## 2021-10-29 MED ORDER — ACETAMINOPHEN 650 MG RE SUPP: 650.0000 mg | Freq: Four times a day (QID) | RECTAL | Status: DC | PRN

## 2021-10-29 MED ORDER — IOHEXOL 350 MG/ML SOLN
75.0000 mL | Freq: Once | INTRAVENOUS | Status: AC | PRN
Start: 2021-10-29 — End: 2021-10-29
  Administered 2021-10-29: 75 mL via INTRAVENOUS

## 2021-10-29 MED ORDER — CITALOPRAM HYDROBROMIDE 20 MG PO TABS
40.0000 mg | ORAL_TABLET | Freq: Every day | ORAL | Status: DC
  Administered 2021-10-29 – 2021-11-01 (×4): 40 mg via ORAL
  Filled 2021-10-29 (×4): qty 2

## 2021-10-29 MED ORDER — IPRATROPIUM-ALBUTEROL 0.5-2.5 (3) MG/3ML IN SOLN
3.0000 mL | Freq: Four times a day (QID) | RESPIRATORY_TRACT | Status: DC
  Administered 2021-10-29 – 2021-10-30 (×2): 3 mL via RESPIRATORY_TRACT
  Filled 2021-10-29 (×2): qty 3

## 2021-10-29 NOTE — ED Triage Notes (Signed)
Been having a week of congestion  on antibiotics.  Woke up this am at 0500 with difficulty breathing.

## 2021-10-29 NOTE — ED Notes (Signed)
Patient ambulated on RA. O2 94% and  HR 90-100 prior to ambulation. Patient O2 during ambulation 90-92% and HR 110-115. Patient was shob on exertion during ambulation trial. Patient returned to room and O2 94% on RA and HR 100-105 at rest. PA, Gertie Fey, and RN , Zenia Resides, made aware. RT will continue to monitor patient and respiratory needs during encounter.

## 2021-10-29 NOTE — ED Notes (Signed)
Per Domenic Moras PA  hold Bolus until lactic back

## 2021-10-29 NOTE — ED Notes (Signed)
ED Provider at bedside. 

## 2021-10-29 NOTE — ED Provider Notes (Addendum)
[facility] EMERGENCY DEPT Provider Note   CSN: [id number] Arrival date & time: 10/29/21  3299     History  No chief complaint on file.   Karina Wilkinson is a 61 y.o. female.  The history is provided by the patient and medical records. No language interpreter was used.   61 year old female with significant history of asthma, anxiety, depression, who presents with cold symptoms.  Patient states 8 days ago she developed fever chills body aches sore throat congestion cough and shortness of breath.  She did home COVID test that was negative.  3 days ago she went to see her primary care provider and states chest x-ray was performed and was negative for pneumonia.  Patient subsequently prescribe steroids, and azithromycin for her symptoms.  Despite taking the medication, she still endorse having shortness of breath.  Her cough has been persistent, sometimes productive with green sputum and sometimes with streaks of blood.  States she coughed so hard she has some urinary incontinence.  She denies any significant wheezing denies leg swelling, calf pain, nausea vomiting.  She did endorse several bouts of loose stools several days prior but that has since resolved.  Most of his symptoms including a fever has improved however she still endorse persistent cough and shortness of breath.  She does not have rescue inhaler.  She denies any prior history of PE or DVT.  She denies any exertional chest pain  Home Medications Prior to Admission medications   Medication Sig Start Date End Date Taking? Authorizing Provider  busPIRone (BUSPAR) 10 MG tablet Take 10 mg by mouth 3 (three) times daily.    [provider]  citalopram (CELEXA) 20 MG tablet Take 60 mg by mouth daily.    [provider]  COVID-19 mRNA Vac-TriS, Pfizer, (PFIZER-BIONT COVID-19 VAC-TRIS) SUSP injection Inject into the muscle. 01/22/21   Carlyle Basques, MD  cyclobenzaprine (FLEXERIL) 10 MG tablet Take 10 mg by  mouth as needed for muscle spasms.    [provider]  fluticasone (FLONASE) 50 MCG/ACT nasal spray Place 1 spray into both nostrils daily.    [provider]  Liraglutide -Weight Management (SAXENDA) 18 MG/3ML SOPN Inject into the skin.    [provider]  rosuvastatin (CRESTOR) 10 MG tablet Take 10 mg by mouth daily.    [provider]  Vitamin D, Ergocalciferol, (DRISDOL) 50000 units CAPS capsule Take 50,000 Units by mouth every 7 (seven) days.    [provider]      Allergies    Patient has no known allergies.    Review of Systems   Review of Systems  All other systems reviewed and are negative.  Physical Exam Updated Vital Signs BP 106/66 (BP Location: Right Arm)    Pulse 80    Temp 98.6 F (37 C) (Oral)    Resp 20    Ht 5' 2.5" (1.588 m)    Wt 73.5 kg    SpO2 95%    BMI 29.16 kg/m  Physical Exam Vitals and nursing note reviewed.  Constitutional:      General: She is not in acute distress.    Appearance: She is well-developed.  HENT:     Head: Atraumatic.  Eyes:     Conjunctiva/sclera: Conjunctivae normal.  Cardiovascular:     Rate and Rhythm: Normal rate and regular rhythm.     Pulses: Normal pulses.     Heart sounds: Normal heart sounds.  Pulmonary:     Effort: Pulmonary effort  is normal.     Breath sounds: No wheezing, rhonchi or rales.  Abdominal:     Palpations: Abdomen is soft.     Tenderness: There is no abdominal tenderness.  Musculoskeletal:     Cervical back: Neck supple.     Right lower leg: No edema.     Left lower leg: No edema.  Skin:    Capillary Refill: Capillary refill takes less than 2 seconds.     Findings: No rash.  Neurological:     Mental Status: She is alert and oriented to person, place, and time.  Psychiatric:        Mood and Affect: Mood normal.    ED Results / Procedures / Treatments   Labs (all labs ordered are listed, but only abnormal results are displayed) Labs Reviewed  BASIC  METABOLIC PANEL - Abnormal; Notable for the following components:      Result Value   Potassium 3.1 (*)    All other components within normal limits  CBC WITH DIFFERENTIAL/PLATELET - Abnormal; Notable for the following components:   WBC 16.6 (*)    Hemoglobin 10.9 (*)    HCT 33.9 (*)    MCH 25.8 (*)    Platelets 492 (*)    Neutro Abs 12.0 (*)    Monocytes Absolute 1.5 (*)    Abs Immature Granulocytes 0.52 (*)    All other components within normal limits  BRAIN NATRIURETIC PEPTIDE - Abnormal; Notable for the following components:   B Natriuretic Peptide 478.0 (*)    All other components within normal limits  URINALYSIS, ROUTINE W REFLEX MICROSCOPIC - Abnormal; Notable for the following components:   Color, Urine COLORLESS (*)    All other components within normal limits  RESP PANEL BY RT-PCR (FLU A&B, COVID) ARPGX2  CULTURE, BLOOD (ROUTINE X 2)  CULTURE, BLOOD (ROUTINE X 2)  URINE CULTURE  LACTIC ACID, PLASMA  PROTIME-INR  APTT  PHOSPHORUS  MAGNESIUM  LACTIC ACID, PLASMA  HEPATIC FUNCTION PANEL    EKG EKG Interpretation  Date/Time:  Thursday October 29 2021 07:11:16 EST Ventricular Rate:  81 PR Interval:  163 QRS Duration: 99 QT Interval:  423 QTC Calculation: 491 R Axis:   16 Text Interpretation: Sinus rhythm Probable left atrial enlargement Low voltage, precordial leads Borderline prolonged QT interval Confirmed by Lacretia Leigh ([id number]) on 10/29/2021 8:14:03 AM  Radiology DG Chest 2 View  Addendum Date: 10/29/2021   ADDENDUM REPORT: 10/29/2021 08:18 ADDENDUM: Patient by report is head COVID-19 infection in the past. Findings could also reflect sequela of COVID-19 pneumonia in addition to the previously outlined differential. These results were called by telephone at the time of interpretation on 10/29/2021 at 8:20 am to provider [facility] , who verbally acknowledged these results. Electronically Signed   By: Zetta Bills M.D.   On: 10/29/2021 08:18   Result Date:  10/29/2021 CLINICAL DATA:  A 61 year old female presents with shortness of breath of 1 weeks duration. EXAM: CHEST - 2 VIEW COMPARISON:  None FINDINGS: EKG leads project over the chest. Trachea midline. Cardiomediastinal contours and hilar structures are normal. Peripheral consolidative changes across the RIGHT mid chest situated upon the fissures on the lateral projection. Nodular opacity LEFT chest without lateral projection correlate. No sign of pleural effusion.  No pneumothorax. On limited assessment there is no acute skeletal process. IMPRESSION: Peripheral consolidative changes along the pleural margins in the RIGHT chest in upper and middle lobe based on lateral projection. Findings are suspicious for multifocal pneumonia.  Given distribution pulmonary infarct is a differential consideration. Nodular opacity in the LEFT mid chest may also represent inflammation. Suggest follow-up imaging to exclude a small pulmonary nodule. Electronically Signed: By: Zetta Bills M.D. On: 10/29/2021 08:04    Procedures Procedures    Medications Ordered in ED Medications  lactated ringers infusion (has no administration in time range)  cefTRIAXone (ROCEPHIN) 2 g in sodium chloride 0.9 % 100 mL IVPB (0 g Intravenous Stopped 10/29/21 0929)  azithromycin (ZITHROMAX) 500 mg in sodium chloride 0.9 % 250 mL IVPB (has no administration in time range)  magnesium sulfate IVPB 2 g 50 mL (2 g Intravenous New Bag/Given 10/29/21 0926)  iohexol (OMNIPAQUE) 350 MG/ML injection 75 mL (has no administration in time range)  potassium chloride SA (KLOR-CON M) CR tablet 40 mEq (has no administration in time range)  potassium chloride 10 mEq in 100 mL IVPB (has no administration in time range)  benzonatate (TESSALON) capsule 100 mg (100 mg Oral Given 10/29/21 8101)    ED Course/ Medical Decision Making/ A&P                           Medical Decision Making Amount and/or Complexity of Data Reviewed Labs: ordered. Radiology:  ordered.  Risk Prescription drug management. Decision regarding hospitalization.   BP 106/66 (BP Location: Right Arm)    Pulse 80    Temp 98.6 F (37 C) (Oral)    Resp 20    Ht 5' 2.5" (1.588 m)    Wt 73.5 kg    SpO2 95%    BMI 29.16 kg/m   7:40 AM This is a 61 year old female with history of asthma who presents with cold symptoms which has been lingering for over a week.  Symptoms consistent with viral illness as she endorsed fever chills body aches recurrent cough and sore throat.  She was seen recently by her primary care provider for her symptoms, had a negative chest x-ray several days prior and was given azithromycin as well as prednisone and Delsym.  She is here with worsening shortness of breath.  On exam, patient is overall well-appearing, speaking in complete sentences, lungs clear on auscultation, no appreciable wheezing, she does not appears to be in fluid overload as no JVD no pitting edema and lungs are clear.  I have considered using PERC criteria to rule out PE but due to her age it is not operable.  However she scored low on while scratch area for PE.  Symptom is not consistent with ACS.  Given duration of her complaint, it would be reasonable to obtain a chest x-ray to rule out superimposed pneumonia.  8:10 AM X-ray obtained, individually visualized and interpreted by me.  Evidence of consolidative changes to the right chest upper middle lobe suspicious for multifocal pneumonia.  Radiologist also mention that the given distribution pulmonary infarct is a differential consideration.  There is also a nodular opacity in the left mid chest which may also represents inflammation.  Patient did disclose that she was diagnosed with COVID in January.  Her symptoms at this time is more consistent with infectious etiology and less likely to be PE.  Patient is currently taking azithromycin however this is not adequate enough to cover for her infection.  Have patient ambulate and her O2  sats drops down to 90% on room air and HR to 115.  Pt was symptomatic.  Given this finding, will initiate IV antibiotic, I discussed with patient  and we felt admission would be appropriate.  In the setting of a concerning chest x-ray and we cannot fully rule out PE, will obtain chest CT angiogram for further assessment.  10:43 AM Fortunately chest CT angiogram did not show any evidence of PE.  It did confirm multifocal pneumonia.  At this time patient is stable to be admitted for further management.  9:38 AM Patient has an elevated white count of 16.6 however in the setting of recent prednisone use this is not entirely unusual.  Lactic acid is normal therefore I will avoid fluid resuscitation at 30 mill per kilogram as patient does have an elevated BNP of 478.  She does not appear to be fluid overloaded on my exam.  Viral respiratory panel negative.  Normal magnesium level and normal phosphorus level.  Potassium is 3.1, will give supplementation.  Urine without signs of urinary tract infection.   This patient presents to the ED for concern of sob, this involves an extensive number of treatment options, and is a complaint that carries with it a high risk of complications and morbidity.  The differential diagnosis includes pneumonia, viral illness, PE, reactive airway disease, asthma exacerbation, CHF, PTX  Co morbidities that complicate the patient evaluation asthma Additional history obtained:  Additional history obtained from husband at bedside External records from outside source obtained and reviewed including notes from prior PCP visit  Lab Tests:  I Ordered, and personally interpreted labs.  The pertinent results include:  as above  Imaging Studies ordered:  I ordered imaging studies including CXR I independently visualized and interpreted imaging which showed multifocal pneumonia I agree with the radiologist interpretation  Cardiac Monitoring:  The patient was maintained on a  cardiac monitor.  I personally viewed and interpreted the cardiac monitored which showed an underlying rhythm of: sinus rhythm  Medicines ordered and prescription drug management:  I ordered medication including rocephin/zithromax  for pneumonia Reevaluation of the patient after these medicines showed that the patient improved I have reviewed the patients home medicines and have made adjustments as needed  Test Considered: chest CTA  Critical Interventions: IVF  IV abx  Supplemental O2  Consultations Obtained:  I requested consultation with the [facility] Dr. Olevia Bowens,  and discussed lab and imaging findings as well as pertinent plan - they recommend: admission to telemetry.  Also recommend checking mag and phos level due to pt's borderline prolonged QT on EKG.  Also recommend starting Mag sulfate in the ER.    Problem List / ED Course: multifocal pneumonia  Hypoxia  hypokalemia  Reevaluation:  After the interventions noted above, I reevaluated the patient and found that they have :improved  Social Determinants of Health: No tobacco use  Dispostion:  After consideration of the diagnostic results and the patients response to treatment, I feel that the patent would benefit from admission.         Final Clinical Impression(s) / ED Diagnoses Final diagnoses:  Multifocal pneumonia  Hypokalemia  Acute respiratory failure with hypoxemia [facility])    Rx / DC Orders ED Discharge Orders     None         Domenic Moras, PA-C 10/29/21 1045    Domenic Moras, PA-C 10/29/21 1046    Lacretia Leigh, MD 11/02/21 1019

## 2021-10-29 NOTE — H&P (Signed)
History and Physical    Patient: Karina Wilkinson [id number] DOB: 09-Nov-1960 DOA: 10/29/2021 DOS: the patient was seen and examined on 10/29/2021 PCP: Janie Morning, DO  Patient coming from: Home  Chief Complaint:  Chief Complaint  Patient presents with   Shortness of Breath   HPI: Karina Wilkinson is a 61 y.o. female with medical history significant of seasonal allergies, asthma, anal fissure, anxiety, osteoarthritis, depression, fibromyalgia, gestational diabetes, hyperlipidemia, overweight who is coming to the emergency department with complaints of progressively worse productive cough with pleuritic chest pain, which started last week with fever, chills, congestion and shortness of breath.  She has had trouble sleeping because of the cough.  Her appetite is decreased.  She was given azithromycin and prednisone earlier this week without significant relief.  No palpitations or lower extremity edema.  Abdominal pain, nausea, emesis, diarrhea, constipation, melena or hematochezia.  She has had some episodes of stress incontinence while coughing, but denied flank pain, dysuria, frequency or hematuria.  No polyuria, polydipsia, polyphagia or blurred vision.  ED course: Initial vital signs were temperature 98.6 F, pulse 80, respiration 20, BP 106/66 mmHg O2 sat 95% on room air.  The patient received 40 mEq of K-Lor p.o., 10 mEq of KCl IVPB, magnesium sulfate 2 g IVPB, hydrocodone 7.5/acetaminophen 325 mg 1 tablet p.o., ceftriaxone and Zithromax IVPB.  Lab work: Urinalysis was unremarkable.  Coronavirus and influenza PCR negative.  CBC is her white count 16.6, hemoglobin 10.9 g/dL platelets 492.  Normal PT, INR and PTT.  Lactic acid x2 was normal.  BNP was 478.0 pg/mL.  BMP was normal, except for a potassium of 3.1 mmol/L.  LFTs with an albumin level of 3.4 g/dL and AST is slightly elevated at 43 units/L.  However the rest of the hepatic functions were normal.  Imaging: Chest radiograph showed findings  suspicious for multifocal pneumonia.  CTA chest did not show pulmonary embolus.  There was bilateral multilobar pneumonia, right worse than left.  Please see images and full radiology report for further details.  Review of Systems: As mentioned in the history of present illness. All other systems reviewed and are negative. Past Medical History:  Diagnosis Date   Allergy    Anal fissure    Anxiety    Arthritis    Asthma    Depression    Fibromyalgia    Gestational diabetes    Heart murmur    HLD (hyperlipidemia)    Overweight (BMI 25.0-29.9) 10/29/2021   Past Surgical History:  Procedure Laterality Date   FOOT SURGERY Right 2000   low back surgery     LUMBAR DISC SURGERY  2008   plantar fasciitis surgery     Social History:  reports that she quit smoking about 38 years ago. Her smoking use included cigarettes. She has never used smokeless tobacco. She reports current alcohol use of about 3.0 standard drinks per week. She reports that she does not use drugs.  No Known Allergies  Family History  Problem Relation Age of Onset   Hypertension Mother    Peptic Ulcer Mother    Hypertension Father    Cancer Father    Heart disease Father    Hyperlipidemia Father    Stroke Father    Mental illness Maternal Grandmother    Stroke Maternal Grandmother    Hypertension Maternal Grandmother    Diabetes Paternal Grandmother    Ovarian cancer Paternal Grandmother    Irritable bowel syndrome Sister    Ovarian cancer  Paternal Aunt    Ulcerative colitis Daughter     Prior to Admission medications   Medication Sig Start Date End Date Taking? Authorizing Provider  azithromycin (ZITHROMAX) 500 MG tablet Take 500 mg by mouth daily.   Yes [provider]  buPROPion (WELLBUTRIN XL) 150 MG 24 hr tablet Take 150 mg by mouth every morning. 10/23/21  Yes [provider]  busPIRone (BUSPAR) 10 MG tablet Take 10 mg by mouth 3 (three) times daily.   Yes [provider]   citalopram (CELEXA) 20 MG tablet Take 60 mg by mouth daily.   Yes [provider]  fluticasone (FLONASE) 50 MCG/ACT nasal spray Place 1 spray into both nostrils daily.   Yes [provider]  HYDROcodone bit-homatropine (HYCODAN) 5-1.5 MG/5ML syrup Take 5 mLs by mouth every 6 (six) hours as needed. 10/26/21  Yes [provider]  hydroxychloroquine (PLAQUENIL) 200 MG tablet Take 400 mg by mouth daily. 10/18/21  Yes [provider]  predniSONE (DELTASONE) 10 MG tablet Take 10 mg by mouth daily with breakfast.   Yes [provider]  rosuvastatin (CRESTOR) 10 MG tablet Take 10 mg by mouth daily.   Yes [provider]  Vitamin D, Ergocalciferol, (DRISDOL) 50000 units CAPS capsule Take 50,000 Units by mouth every 7 (seven) days.   Yes [provider]  COVID-19 mRNA Vac-TriS, Pfizer, (PFIZER-BIONT COVID-19 VAC-TRIS) SUSP injection Inject into the muscle. 01/22/21   Carlyle Basques, MD    Physical Exam: Vitals:   10/29/21 1137 10/29/21 1306 10/29/21 1310 10/29/21 1422  BP:  101/63  (!) 91/53  Pulse:  93  80  Resp:  18  20  Temp:   98.6 F (37 C) 98.8 F (37.1 C)  TempSrc:   Oral Oral  SpO2: 98% 95%  94%  Weight:      Height:       Physical Exam Vitals and nursing note reviewed.  HENT:     Head: Normocephalic and atraumatic.  Eyes:     Pupils: Pupils are equal, round, and reactive to light.  Neck:     Vascular: No JVD.  Cardiovascular:     Rate and Rhythm: Normal rate and regular rhythm.  Pulmonary:     Effort: Tachypnea present.     Breath sounds: Examination of the right-middle field reveals rales. Examination of the left-middle field reveals rales. Rhonchi and rales present.  Musculoskeletal:     Cervical back: Neck supple.     Right lower leg: No edema.     Left lower leg: No edema.  Skin:    General: Skin is warm and dry.  Neurological:     General: No focal deficit present.     Mental Status: She is alert and  oriented to person, place, and time.  Psychiatric:        Mood and Affect: Mood normal.        Behavior: Behavior normal.   Data Reviewed:  There are no new results to review at this time.  Assessment and Plan: Principal Problem:   Multifocal pneumonia Admit to PCU/inpatient. Supplemental oxygen as needed. DuoNeb 4 times a day. Solu-Medrol 125 mg IVP x1. Antitussives as needed. Analgesics as needed. Continue ceftriaxone 1 g IVPB every 24 hours. Begin doxycycline 100 mg IVPB every 12 hours. Check sputum Gram stain, culture and sensitivity. Follow-up blood culture and sensitivity. Check strep pneumoniae urinary antigen. Follow-up CBC and chemistry in the morning.  Active Problems:   Hypokalemia Replacing. Follow-up potassium  level.    Borderline prolonged QT interval Decreased Celexa to 40 mg daily. Switched azithromycin to doxycycline. Avoid QT prolonging meds if possible. Magnesium sulfate 2 g IVPB given earlier. Optimize potassium level.    Overweight (BMI 25.0-29.9) Lifestyle modifications. Follow-up with PCP.    Advance Care Planning:   Code Status: Full Code   Consults:   Family Communication: Her daughter was at bedside.  Severity of Illness: The appropriate patient status for this patient is INPATIENT. Inpatient status is judged to be reasonable and necessary in order to provide the required intensity of service to ensure the patient's safety. The patient's presenting symptoms, physical exam findings, and initial radiographic and laboratory data in the context of their chronic comorbidities is felt to place them at high risk for further clinical deterioration. Furthermore, it is not anticipated that the patient will be medically stable for discharge from the hospital within 2 midnights of admission.   * I certify that at the point of admission it is my clinical judgment that the patient will require inpatient hospital care spanning beyond 2 midnights from the  point of admission due to high intensity of service, high risk for further deterioration and high frequency of surveillance required.*  Author: Reubin Milan, MD 10/29/2021 5:55 PM  For on call review [email or web address].   This document was prepared using Par[employer]ic and may contain some unintended transcription errors.

## 2021-10-29 NOTE — Progress Notes (Signed)
[facility] will be following Code Sepsis bundle.

## 2021-10-29 NOTE — ED Notes (Signed)
Handoff report given to [facility] 

## 2021-10-29 NOTE — ED Notes (Addendum)
Handoff report given to Anderson Malta RN on 4E at [facility]

## 2021-10-29 NOTE — ED Notes (Signed)
Doug with [facility] called for hosp. Consult.. AOM 8:26

## 2021-10-29 NOTE — Progress Notes (Signed)
Plan of Care Note for accepted transfer   Patient: Karina Wilkinson MRN: [id number]   DOA: 10/29/2021  Facility requesting transfer: [facility]. Requesting Provider: Domenic Moras PA-C and Lacretia Leigh, MD. Reason for transfer: Multifocal pneumonia. Facility course: 61 yo female with a PMH of asthma, anxiety, depression, overweight with a BMI of 29.16 kg/m who presented to the emergency department with fever, chills, body aches, sore throat, congestion and dyspnea for the past 8 days.  She saw her PCP who prescribed her azithromycin and prednisone.  However, her symptoms have continued.  She is now having productive cough of green sputum with streak hemoptysis.  She desaturated on exertion to the 80s while in the emergency department.  Her O2 sat on room air is ranging from 90 to 94%.  She has a white count of 16.6.  Imaging shows multifocal pneumonia.  Chemistry is pending.  Received LR fluid bolus and started on CAP antibiotic coverage.  CTA chest had been ordered pending renal function results.  EKG showed borderline prolonged QT segment.  Plan of care: The patient is accepted for admission to progressive unit, at [facility]  I have advised our ER partners to switch azithromycin to doxycycline, check and supplement magnesium.  Follow-up potassium level and keep over 4.0 mmol/L.  They will update [place] if there are any new issues on pending chemistry and imaging results.  Author: Reubin Milan, MD 10/29/2021  Check [email or web address] for on-call coverage.  Nursing staff, Please call [place] number on Amion as soon as patient's arrival, so appropriate admitting provider can evaluate the pt.

## 2021-10-29 NOTE — Plan of Care (Signed)
  Problem: Activity: Goal: Ability to tolerate increased activity will improve Outcome: Progressing   Problem: Clinical Measurements: Goal: Ability to maintain a body temperature in the normal range will improve Outcome: Progressing   Problem: Respiratory: Goal: Ability to maintain adequate ventilation will improve Outcome: Progressing Goal: Ability to maintain a clear airway will improve Outcome: Progressing   

## 2021-10-30 ENCOUNTER — Inpatient Hospital Stay (HOSPITAL_COMMUNITY): Payer: BC Managed Care – PPO

## 2021-10-30 DIAGNOSIS — R0609 Other forms of dyspnea: Secondary | ICD-10-CM

## 2021-10-30 DIAGNOSIS — R9431 Abnormal electrocardiogram [ECG] [EKG]: Secondary | ICD-10-CM

## 2021-10-30 DIAGNOSIS — E876 Hypokalemia: Secondary | ICD-10-CM | POA: Diagnosis not present

## 2021-10-30 DIAGNOSIS — J189 Pneumonia, unspecified organism: Secondary | ICD-10-CM | POA: Diagnosis not present

## 2021-10-30 LAB — ECHOCARDIOGRAM COMPLETE
AR max vel: 1.15 cm2
AV Area VTI: 1.26 cm2
AV Area mean vel: 1.11 cm2
AV Mean grad: 6 mmHg
AV Peak grad: 12.8 mmHg
Ao pk vel: 1.79 m/s
Area-P 1/2: 1.72 cm2
Calc EF: 71.1 %
Height: 62.5 in
S' Lateral: 2.8 cm
Single Plane A2C EF: 66.4 %
Single Plane A4C EF: 76.2 %
Weight: 2592 oz

## 2021-10-30 LAB — CBC
HCT: 34.9 % — ABNORMAL LOW (ref 36.0–46.0)
Hemoglobin: 11 g/dL — ABNORMAL LOW (ref 12.0–15.0)
MCH: 26.4 pg (ref 26.0–34.0)
MCHC: 31.5 g/dL (ref 30.0–36.0)
MCV: 83.7 fL (ref 80.0–100.0)
Platelets: 460 10*3/uL — ABNORMAL HIGH (ref 150–400)
RBC: 4.17 MIL/uL (ref 3.87–5.11)
RDW: 14.9 % (ref 11.5–15.5)
WBC: 12.1 10*3/uL — ABNORMAL HIGH (ref 4.0–10.5)
nRBC: 0 % (ref 0.0–0.2)

## 2021-10-30 LAB — COMPREHENSIVE METABOLIC PANEL
ALT: 63 U/L — ABNORMAL HIGH (ref 0–44)
AST: 56 U/L — ABNORMAL HIGH (ref 15–41)
Albumin: 2.7 g/dL — ABNORMAL LOW (ref 3.5–5.0)
Alkaline Phosphatase: 106 U/L (ref 38–126)
Anion gap: 7 (ref 5–15)
BUN: 12 mg/dL (ref 8–23)
CO2: 26 mmol/L (ref 22–32)
Calcium: 8.1 mg/dL — ABNORMAL LOW (ref 8.9–10.3)
Chloride: 107 mmol/L (ref 98–111)
Creatinine, Ser: 0.69 mg/dL (ref 0.44–1.00)
GFR, Estimated: >60 mL/min (ref >60)
Glucose, Bld: 143 mg/dL — ABNORMAL HIGH (ref 70–99)
Potassium: 4.5 mmol/L (ref 3.5–5.1)
Sodium: 140 mmol/L (ref 135–145)
Total Bilirubin: 0.2 mg/dL — ABNORMAL LOW (ref 0.3–1.2)
Total Protein: 6.7 g/dL (ref 6.5–8.1)

## 2021-10-30 LAB — URINE CULTURE: Culture: NO GROWTH

## 2021-10-30 LAB — HIV ANTIBODY (ROUTINE TESTING W REFLEX): HIV Screen 4th Generation wRfx: NONREACTIVE

## 2021-10-30 MED ORDER — IPRATROPIUM-ALBUTEROL 0.5-2.5 (3) MG/3ML IN SOLN
3.0000 mL | Freq: Three times a day (TID) | RESPIRATORY_TRACT | Status: DC
  Administered 2021-10-30 – 2021-11-01 (×6): 3 mL via RESPIRATORY_TRACT
  Filled 2021-10-30 (×8): qty 3

## 2021-10-30 MED ORDER — DOXYCYCLINE HYCLATE 100 MG PO TABS
100.0000 mg | ORAL_TABLET | Freq: Two times a day (BID) | ORAL | Status: DC
  Administered 2021-10-30 – 2021-11-01 (×4): 100 mg via ORAL
  Filled 2021-10-30 (×4): qty 1

## 2021-10-30 NOTE — Progress Notes (Signed)
°  Progress Note   Patient: Karina Wilkinson GUY:[id number] DOB: [birth year] DOA: 10/29/2021     1 DOS: the patient was seen and examined on 10/30/2021   Brief hospital course: 61 y.o. female with medical history significant of seasonal allergies, asthma, anal fissure, anxiety, osteoarthritis, depression, fibromyalgia, gestational diabetes, hyperlipidemia, overweight who is coming to the emergency department with complaints of progressively worse productive cough with pleuritic chest pain, which started last week with fever, chills, congestion and shortness of breath.  She has had trouble sleeping because of the cough.  Her appetite is decreased.  She was given azithromycin and prednisone earlier this week without significant relief.  No palpitations or lower extremity edema.  Abdominal pain, nausea, emesis, diarrhea, constipation, melena or hematochezia.  She has had some episodes of stress incontinence while coughing, but denied flank pain, dysuria, frequency or hematuria.  No polyuria, polydipsia, polyphagia or blurred vision.  Assessment and Plan: No notes have been filed under this hospital service. Service: Hospitalist  Principal Problem:   Multifocal pneumonia -Cont DuoNeb 4 times a day. -given Solu-Medrol 125 mg IVP x1. -cont Antitussives as needed. -Continue ceftriaxone 1 g IVPB every 24 hours. -cont doxycycline 100 mg IVPB every 12 hours. -Follow-up blood culture and sensitivity.   Active Problems:   Hypokalemia replaced Follow-up potassium level.     Borderline prolonged QT interval -Decreased Celexa to 40 mg daily. -Switched azithromycin to doxycycline. -Avoid QT prolonging meds if possible. Magnesium replaced -recheck bmet      Overweight (BMI 25.0-29.9) -Lifestyle modifications. -Follow-up with PCP.     Subjective: Reports feeling much better today  Physical Exam: Vitals:   10/30/21 0609 10/30/21 0807 10/30/21 0809 10/30/21 1552  BP: 96/70   123/64  Pulse: 74   94   Resp: 18   17  Temp: 97.6 F (36.4 C)   97.9 F (36.6 C)  TempSrc: Oral   Oral  SpO2: 94% 94% 94% 95%  Weight:      Height:       General exam: Awake, laying in bed, in nad Respiratory system: Normal respiratory effort, no wheezing Cardiovascular system: regular rate, s1, s2 Gastrointestinal system: Soft, nondistended, positive BS Central nervous system: CN2-12 grossly intact, strength intact Extremities: Perfused, no clubbing Skin: Normal skin turgor, no notable skin lesions seen Psychiatry: Mood normal // no visual hallucinations   Data Reviewed:  Labs reviewed.  Cr 0.69  Family Communication: Pt in room, family not at bedside  Disposition: Status is: Inpatient Remains inpatient appropriate because: severity of illness     Planned Discharge Destination: Home      Author: Marylu Lund, MD 10/30/2021 4:56 PM  For on call review [email or web address]

## 2021-10-30 NOTE — Progress Notes (Signed)
° °  Echocardiogram 2D Echocardiogram has been performed.  Beryle Beams 10/30/2021, 1:48 PM

## 2021-10-30 NOTE — Progress Notes (Signed)
SATURATION QUALIFICATIONS:   Patient Saturations on Room Air at Rest = 94-95%  Patient Saturations on Room Air while Ambulating = 91-92%  Pt. appears exhausted after her walk in hallway(back and forth) was able to maintain her O2 sats above 90's.

## 2021-10-30 NOTE — Progress Notes (Signed)
PHARMACIST - PHYSICIAN COMMUNICATION DR:   Wyline Copas CONCERNING: Antibiotic IV to Oral Route Change Policy  RECOMMENDATION: This patient is receiving doxycycline by the intravenous route.  Based on criteria approved by the Pharmacy and Therapeutics Committee, the antibiotic(s) is/are being converted to the equivalent oral dose form(s).   DESCRIPTION: These criteria include: Patient being treated for a respiratory tract infection, urinary tract infection, cellulitis or clostridium difficile associated diarrhea if on metronidazole The patient is not neutropenic and does not exhibit a GI malabsorption state The patient is eating (either orally or via tube) and/or has been taking other orally administered medications for at least 24 hours The patient is improving clinically and has a Tmax < 100.5  If you have questions about this conversion, please contact the Pharmacy Department  []   [phone] )  [facility] []   [phone] )  [facility]  []   [phone] )  [facility] [x]   [phone] )  [place], PharmD 10/30/2021 11:57 AM

## 2021-10-31 ENCOUNTER — Inpatient Hospital Stay (HOSPITAL_COMMUNITY): Payer: BC Managed Care – PPO

## 2021-10-31 DIAGNOSIS — R0902 Hypoxemia: Secondary | ICD-10-CM

## 2021-10-31 DIAGNOSIS — J9601 Acute respiratory failure with hypoxia: Secondary | ICD-10-CM

## 2021-10-31 DIAGNOSIS — E663 Overweight: Secondary | ICD-10-CM | POA: Diagnosis not present

## 2021-10-31 DIAGNOSIS — J189 Pneumonia, unspecified organism: Secondary | ICD-10-CM | POA: Diagnosis not present

## 2021-10-31 LAB — COMPREHENSIVE METABOLIC PANEL
ALT: 126 U/L — ABNORMAL HIGH (ref 0–44)
AST: 97 U/L — ABNORMAL HIGH (ref 15–41)
Albumin: 2.9 g/dL — ABNORMAL LOW (ref 3.5–5.0)
Alkaline Phosphatase: 101 U/L (ref 38–126)
Anion gap: 8 (ref 5–15)
BUN: 10 mg/dL (ref 8–23)
CO2: 28 mmol/L (ref 22–32)
Calcium: 8.3 mg/dL — ABNORMAL LOW (ref 8.9–10.3)
Chloride: 100 mmol/L (ref 98–111)
Creatinine, Ser: 0.73 mg/dL (ref 0.44–1.00)
GFR, Estimated: >60 mL/min (ref >60)
Glucose, Bld: 89 mg/dL (ref 70–99)
Potassium: 4 mmol/L (ref 3.5–5.1)
Sodium: 136 mmol/L (ref 135–145)
Total Bilirubin: 0.3 mg/dL (ref 0.3–1.2)
Total Protein: 6.9 g/dL (ref 6.5–8.1)

## 2021-10-31 LAB — CBC
HCT: 35.2 % — ABNORMAL LOW (ref 36.0–46.0)
Hemoglobin: 11 g/dL — ABNORMAL LOW (ref 12.0–15.0)
MCH: 26.4 pg (ref 26.0–34.0)
MCHC: 31.3 g/dL (ref 30.0–36.0)
MCV: 84.4 fL (ref 80.0–100.0)
Platelets: 490 10*3/uL — ABNORMAL HIGH (ref 150–400)
RBC: 4.17 MIL/uL (ref 3.87–5.11)
RDW: 15.2 % (ref 11.5–15.5)
WBC: 12.4 10*3/uL — ABNORMAL HIGH (ref 4.0–10.5)
nRBC: 0 % (ref 0.0–0.2)

## 2021-10-31 LAB — MRSA NEXT GEN BY PCR, NASAL: MRSA by PCR Next Gen: NOT DETECTED

## 2021-10-31 MED ORDER — LOPERAMIDE HCL 2 MG PO CAPS
2.0000 mg | ORAL_CAPSULE | ORAL | Status: DC | PRN
  Administered 2021-10-31 – 2021-11-01 (×5): 2 mg via ORAL
  Filled 2021-10-31 (×5): qty 1

## 2021-10-31 MED ORDER — FLUTICASONE PROPIONATE 50 MCG/ACT NA SUSP
1.0000 | Freq: Every day | NASAL | Status: DC
  Administered 2021-10-31 – 2021-11-01 (×2): 1 via NASAL
  Filled 2021-10-31: qty 16

## 2021-10-31 MED ORDER — BENZONATATE 100 MG PO CAPS
100.0000 mg | ORAL_CAPSULE | Freq: Three times a day (TID) | ORAL | Status: DC | PRN
  Administered 2021-10-31 – 2021-11-01 (×2): 100 mg via ORAL
  Filled 2021-10-31 (×2): qty 1

## 2021-10-31 NOTE — Progress Notes (Signed)
°  Progress Note   Patient: Karina Wilkinson GHW:[id number] DOB: 1961-02-18 DOA: 10/29/2021     2 DOS: the patient was seen and examined on 10/31/2021   Brief hospital course: 61 y.o. female with medical history significant of seasonal allergies, asthma, anal fissure, anxiety, osteoarthritis, depression, fibromyalgia, gestational diabetes, hyperlipidemia, overweight who is coming to the emergency department with complaints of progressively worse productive cough with pleuritic chest pain, which started last week with fever, chills, congestion and shortness of breath.  She has had trouble sleeping because of the cough.  Her appetite is decreased.  She was given azithromycin and prednisone earlier this week without significant relief.  No palpitations or lower extremity edema.  Abdominal pain, nausea, emesis, diarrhea, constipation, melena or hematochezia.  She has had some episodes of stress incontinence while coughing, but denied flank pain, dysuria, frequency or hematuria.  No polyuria, polydipsia, polyphagia or blurred vision.  Assessment and Plan: No notes have been filed under this hospital service. Service: Hospitalist  Principal Problem:   Multifocal pneumonia -Cont DuoNeb 4 times a day. -given Solu-Medrol 125 mg IVP x1 initially -cont Antitussives as needed. -Continue ceftriaxone 1 g IVPB every 24 hours. -cont doxycycline 100 mg IVPB every 12 hours. -This AM, pt reported increased dyspnea. Ordered and reviewed CXR. Findings of continued multiple foci of infiltrates in both lungs, more so on R with possible increase in infiltrate in RUL -discussed with pharmacy. Cont current abx as tolerated   Active Problems:   Hypokalemia Normalized     Borderline prolonged QT interval -Decreased Celexa to 40 mg daily. -Switched azithromycin to doxycycline. -Avoid QT prolonging meds if possible. Magnesium replaced -Cont to follow BMET trends     Overweight (BMI 25.0-29.9) -Lifestyle  modifications. -Follow-up with PCP.     Subjective: Reports increased dyspnea this AM  Physical Exam: Vitals:   10/30/21 2026 10/31/21 0516 10/31/21 0813 10/31/21 1055  BP: 113/68 126/70  113/70  Pulse: (!) 101 76  88  Resp: 20 20  20   Temp: 98.1 F (36.7 C) 98.9 F (37.2 C)  99.3 F (37.4 C)  TempSrc: Oral Oral  Oral  SpO2: 94% 91% 92% 92%  Weight:      Height:       General exam: Conversant, in no acute distress Respiratory system: normal chest rise, clear, no audible wheezing Cardiovascular system: regular rhythm, s1-s2 Gastrointestinal system: Nondistended, nontender, pos BS Central nervous system: No seizures, no tremors Extremities: No cyanosis, no joint deformities Skin: No rashes, no pallor Psychiatry: Affect normal // no auditory hallucinations   Data Reviewed:  Labs reviewed.  Cr 0.69  Family Communication: Pt in room, family not at bedside  Disposition: Status is: Inpatient Remains inpatient appropriate because: severity of illness     Planned Discharge Destination: Home     Author: Marylu Lund, MD 10/31/2021 2:33 PM  For on call review [email or web address]

## 2021-11-01 DIAGNOSIS — E663 Overweight: Secondary | ICD-10-CM | POA: Diagnosis not present

## 2021-11-01 DIAGNOSIS — E876 Hypokalemia: Secondary | ICD-10-CM | POA: Diagnosis not present

## 2021-11-01 DIAGNOSIS — J189 Pneumonia, unspecified organism: Secondary | ICD-10-CM | POA: Diagnosis not present

## 2021-11-01 LAB — CBC
HCT: 36.6 % (ref 36.0–46.0)
Hemoglobin: 11.6 g/dL — ABNORMAL LOW (ref 12.0–15.0)
MCH: 26.4 pg (ref 26.0–34.0)
MCHC: 31.7 g/dL (ref 30.0–36.0)
MCV: 83.2 fL (ref 80.0–100.0)
Platelets: 519 10*3/uL — ABNORMAL HIGH (ref 150–400)
RBC: 4.4 MIL/uL (ref 3.87–5.11)
RDW: 15.1 % (ref 11.5–15.5)
WBC: 12.9 10*3/uL — ABNORMAL HIGH (ref 4.0–10.5)
nRBC: 0.2 % (ref 0.0–0.2)

## 2021-11-01 LAB — COMPREHENSIVE METABOLIC PANEL
ALT: 142 U/L — ABNORMAL HIGH (ref 0–44)
AST: 77 U/L — ABNORMAL HIGH (ref 15–41)
Albumin: 3 g/dL — ABNORMAL LOW (ref 3.5–5.0)
Alkaline Phosphatase: 104 U/L (ref 38–126)
Anion gap: 10 (ref 5–15)
BUN: 10 mg/dL (ref 8–23)
CO2: 27 mmol/L (ref 22–32)
Calcium: 8.6 mg/dL — ABNORMAL LOW (ref 8.9–10.3)
Chloride: 100 mmol/L (ref 98–111)
Creatinine, Ser: 0.76 mg/dL (ref 0.44–1.00)
GFR, Estimated: >60 mL/min (ref >60)
Glucose, Bld: 95 mg/dL (ref 70–99)
Potassium: 4.4 mmol/L (ref 3.5–5.1)
Sodium: 137 mmol/L (ref 135–145)
Total Bilirubin: 0.3 mg/dL (ref 0.3–1.2)
Total Protein: 7 g/dL (ref 6.5–8.1)

## 2021-11-01 MED ORDER — ALBUTEROL SULFATE HFA 108 (90 BASE) MCG/ACT IN AERS: 2.0000 | INHALATION_SPRAY | Freq: Four times a day (QID) | RESPIRATORY_TRACT | 0 refills | Status: DC | PRN

## 2021-11-01 MED ORDER — CEFDINIR 300 MG PO CAPS: 300.0000 mg | ORAL_CAPSULE | Freq: Two times a day (BID) | ORAL | 0 refills | Status: AC

## 2021-11-01 MED ORDER — PANTOPRAZOLE SODIUM 40 MG PO TBEC: 40.0000 mg | DELAYED_RELEASE_TABLET | Freq: Every day | ORAL | 0 refills | Status: DC

## 2021-11-01 MED ORDER — BENZONATATE 100 MG PO CAPS: 100.0000 mg | ORAL_CAPSULE | Freq: Three times a day (TID) | ORAL | 0 refills | Status: DC | PRN

## 2021-11-01 MED ORDER — DOXYCYCLINE HYCLATE 100 MG PO TABS: 100.0000 mg | ORAL_TABLET | Freq: Two times a day (BID) | ORAL | 0 refills | Status: AC

## 2021-11-01 NOTE — Discharge Summary (Signed)
Physician Discharge Summary   Patient: Karina Wilkinson MRN: [id number] DOB: [birth year]  Admit date:     10/29/2021  Discharge date: 11/01/21  Discharge Physician: Marylu Lund   PCP: Janie Morning, DO   Recommendations at discharge:    Follow up with PCP in 1-2 weeks  Discharge Diagnoses: Principal Problem:   Multifocal pneumonia Active Problems:   Hypokalemia   Borderline prolonged QT interval   Overweight (BMI 25.0-29.9)  Resolved Problems:   * No resolved hospital problems. *   Hospital Course: 61 y.o. female with medical history significant of seasonal allergies, asthma, anal fissure, anxiety, osteoarthritis, depression, fibromyalgia, gestational diabetes, hyperlipidemia, overweight who is coming to the emergency department with complaints of progressively worse productive cough with pleuritic chest pain, which started last week with fever, chills, congestion and shortness of breath.  She has had trouble sleeping because of the cough.  Her appetite is decreased.  She was given azithromycin and prednisone earlier this week without significant relief.  No palpitations or lower extremity edema.  Abdominal pain, nausea, emesis, diarrhea, constipation, melena or hematochezia.  She has had some episodes of stress incontinence while coughing, but denied flank pain, dysuria, frequency or hematuria.  No polyuria, polydipsia, polyphagia or blurred vision.   Assessment and Plan: No notes have been filed under this hospital service. Service: Hospitalist  Principal Problem:   Multifocal pneumonia -Pt was cont on DuoNeb 4 times a day. -given Solu-Medrol 125 mg IVP x1 initially -cont Antitussives as needed. -Pt was continued on rocephin and doxycycline, would complete course on d/c -able to ambulate in hallway, maintaining O2 sats   Active Problems:   Hypokalemia Normalized     Borderline prolonged QT interval -Decreased Celexa to 40 mg daily while in hospital. -Switched azithromycin  to doxycycline. -Avoid QT prolonging meds if possible. Magnesium replaced     Overweight (BMI 25.0-29.9) -Lifestyle modifications. -Follow-up with PCP.          Consultants:  Procedures performed:   Disposition: Home Diet recommendation:  Regular diet  DISCHARGE MEDICATION: Allergies as of 11/01/2021   No Known Allergies      Medication List     STOP taking these medications    azithromycin 500 MG tablet Commonly known as: ZITHROMAX       TAKE these medications    albuterol 108 (90 Base) MCG/ACT inhaler Commonly known as: VENTOLIN HFA Inhale 2 puffs into the lungs every 6 (six) hours as needed for wheezing or shortness of breath.   benzonatate 100 MG capsule Commonly known as: TESSALON Take 1 capsule (100 mg total) by mouth 3 (three) times daily as needed for cough.   buPROPion 150 MG 24 hr tablet Commonly known as: WELLBUTRIN XL Take 150 mg by mouth every morning.   busPIRone 10 MG tablet Commonly known as: BUSPAR Take 10 mg by mouth 3 (three) times daily.   cefdinir 300 MG capsule Commonly known as: OMNICEF Take 1 capsule (300 mg total) by mouth 2 (two) times daily for 3 days. Start taking on: November 02, 2021   citalopram 20 MG tablet Commonly known as: CELEXA Take 60 mg by mouth daily.   doxycycline 100 MG tablet Commonly known as: VIBRA-TABS Take 1 tablet (100 mg total) by mouth every 12 (twelve) hours for 4 days.   fluticasone 50 MCG/ACT nasal spray Commonly known as: FLONASE Place 1 spray into both nostrils daily.   HYDROcodone bit-homatropine 5-1.5 MG/5ML syrup Commonly known as: HYCODAN Take 5 mLs by mouth every  6 (six) hours as needed.   hydroxychloroquine 200 MG tablet Commonly known as: PLAQUENIL Take 400 mg by mouth daily.   pantoprazole 40 MG tablet Commonly known as: PROTONIX Take 1 tablet (40 mg total) by mouth daily. Start taking on: November 02, 2021   Pfizer-BioNT COVID-19 Vac-TriS Susp injection Generic drug:  COVID-19 mRNA Vac-TriS Therapist, music) Inject into the muscle.   predniSONE 10 MG tablet Commonly known as: DELTASONE Take 10 mg by mouth daily with breakfast.   rosuvastatin 10 MG tablet Commonly known as: CRESTOR Take 10 mg by mouth daily.   Vitamin D (Ergocalciferol) 1.25 MG (50000 UNIT) Caps capsule Commonly known as: DRISDOL Take 50,000 Units by mouth every 7 (seven) days.        Follow-up Information     Janie Morning, DO Follow up in 2 week(s).   Specialty: Family Medicine Why: Hospital follow up Contact information: [address] [place] [address] [phone]                 Discharge Exam: Danley Danker Weights   10/29/21 9563  Weight: 73.5 kg   General exam: Awake, laying in bed, in nad Respiratory system: Normal respiratory effort, no wheezing Cardiovascular system: regular rate, s1, s2 Gastrointestinal system: Soft, nondistended, positive BS Central nervous system: CN2-12 grossly intact, strength intact Extremities: Perfused, no clubbing Skin: Normal skin turgor, no notable skin lesions seen Psychiatry: Mood normal // no visual hallucinations   Condition at discharge: good  The results of significant diagnostics from this hospitalization (including imaging, microbiology, ancillary and laboratory) are listed below for reference.   Imaging Studies: DG Chest 2 View  Addendum Date: 10/29/2021   ADDENDUM REPORT: 10/29/2021 08:18 ADDENDUM: Patient by report is head COVID-19 infection in the past. Findings could also reflect sequela of COVID-19 pneumonia in addition to the previously outlined differential. These results were called by telephone at the time of interpretation on 10/29/2021 at 8:20 am to provider [facility] , who verbally acknowledged these results. Electronically Signed   By: Zetta Bills M.D.   On: 10/29/2021 08:18   Result Date: 10/29/2021 CLINICAL DATA:  A 61 year old female presents with shortness of breath of 1 weeks  duration. EXAM: CHEST - 2 VIEW COMPARISON:  None FINDINGS: EKG leads project over the chest. Trachea midline. Cardiomediastinal contours and hilar structures are normal. Peripheral consolidative changes across the RIGHT mid chest situated upon the fissures on the lateral projection. Nodular opacity LEFT chest without lateral projection correlate. No sign of pleural effusion.  No pneumothorax. On limited assessment there is no acute skeletal process. IMPRESSION: Peripheral consolidative changes along the pleural margins in the RIGHT chest in upper and middle lobe based on lateral projection. Findings are suspicious for multifocal pneumonia. Given distribution pulmonary infarct is a differential consideration. Nodular opacity in the LEFT mid chest may also represent inflammation. Suggest follow-up imaging to exclude a small pulmonary nodule. Electronically Signed: By: Zetta Bills M.D. On: 10/29/2021 08:04   CT Angio Chest PE W and/or Wo Contrast  Result Date: 10/29/2021 CLINICAL DATA:  Congestion for 1 week.  Difficulty breathing. EXAM: CT ANGIOGRAPHY CHEST WITH CONTRAST TECHNIQUE: Multidetector CT imaging of the chest was performed using the standard protocol during bolus administration of intravenous contrast. Multiplanar CT image reconstructions and MIPs were obtained to evaluate the vascular anatomy. RADIATION DOSE REDUCTION: This exam was performed according to the departmental dose-optimization program which includes automated exposure control, adjustment of the mA and/or kV according to patient size and/or  use of iterative reconstruction technique. CONTRAST:  74mL OMNIPAQUE IOHEXOL 350 MG/ML SOLN COMPARISON:  None. FINDINGS: Cardiovascular: Satisfactory opacification of the pulmonary arteries to the segmental level. No evidence of pulmonary embolism. Normal heart size. No pericardial effusion. Mediastinum/Nodes: No enlarged mediastinal, hilar, or axillary lymph nodes. Thyroid gland, trachea, and  esophagus demonstrate no significant findings. Lungs/Pleura: Patchy areas of airspace disease in bilateral upper lobes, right middle lobe and bilateral lower lobes most concerning for multilobar pneumonia, right worse than left, with a peripheral predominance including atypical viral pneumonia. Upper Abdomen: No acute abnormality. Musculoskeletal: No chest wall abnormality. No acute or significant osseous findings. Review of the MIP images confirms the above findings. IMPRESSION: 1. No pulmonary embolus. 2. Bilateral multilobar pneumonia, right worse than left. Electronically Signed   By: Kathreen Devoid M.D.   On: 10/29/2021 10:03   DG CHEST PORT 1 VIEW  Result Date: 10/31/2021 CLINICAL DATA:  Hypoxia, chest pain EXAM: PORTABLE CHEST 1 VIEW COMPARISON:  10/29/2021 FINDINGS: Cardiac size is within normal limits. There are no signs of pulmonary edema. There is patchy alveolar infiltrate in the lateral aspect of right upper lung fields and right lower lung fields. There is possible increase in infiltrate in the right upper lung fields on the lateral aspect of right lower lung fields. Small patchy infiltrates are seen in the left parahilar region. There is blunting of right lateral CP angle. There is mild blunting of left lateral CP angle. There is no pneumothorax. IMPRESSION: There are multiple foci of infiltrates in both lungs, more so on the right side suggesting multifocal pneumonia. Small pleural effusions, more so on the right side. Electronically Signed   By: Elmer Picker M.D.   On: 10/31/2021 10:33   ECHOCARDIOGRAM COMPLETE  Result Date: 10/30/2021    ECHOCARDIOGRAM REPORT   Patient Name:   ABRINA PETZ Date of Exam: 10/30/2021 Medical Rec #:  [id number]     Height:       62.5 in Accession #:    [id number]    Weight:       162.0 lb Date of Birth:  18-May-1961    BSA:          1.758 m Patient Age:    61 years      BP:           96/70 mmHg Patient Gender: F             HR:           86 bpm. Exam  Location:  Inpatient Procedure: 2D Echo, Cardiac Doppler and Color Doppler Indications:    r06.00 dyspnea / r94.31 abn. ekg  History:        Patient has no prior history of Echocardiogram examinations.                 Signs/Symptoms:Murmur; Risk Factors:Dyslipidemia.  Sonographer:    Beryle Beams Referring Phys: [id number] DAVID MANUEL [place]  1. Left ventricular ejection fraction, by estimation, is 60 to 65%. The left ventricle has normal function. The left ventricle has no regional wall motion abnormalities. Left ventricular diastolic parameters were normal.  2. Right ventricular systolic function is normal. The right ventricular size is normal.  3. Left atrial size was mildly dilated.  4. Right atrial size was mildly dilated.  5. The mitral valve is normal in structure. Trivial mitral valve regurgitation. No evidence of mitral stenosis.  6. The aortic valve is tricuspid. There is mild calcification of the aortic valve.  Aortic valve regurgitation is trivial. Aortic valve sclerosis/calcification is present, without any evidence of aortic stenosis.  7. The inferior vena cava is normal in size with greater than 50% respiratory variability, suggesting right atrial pressure of 3 mmHg. FINDINGS  Left Ventricle: Left ventricular ejection fraction, by estimation, is 60 to 65%. The left ventricle has normal function. The left ventricle has no regional wall motion abnormalities. The left ventricular internal cavity size was normal in size. There is  no left ventricular hypertrophy. Left ventricular diastolic parameters were normal. Right Ventricle: The right ventricular size is normal. No increase in right ventricular wall thickness. Right ventricular systolic function is normal. Left Atrium: Left atrial size was mildly dilated. Right Atrium: Right atrial size was mildly dilated. Pericardium: There is no evidence of pericardial effusion. Mitral Valve: The mitral valve is normal in structure. Trivial mitral valve  regurgitation. No evidence of mitral valve stenosis. Tricuspid Valve: The tricuspid valve is normal in structure. Tricuspid valve regurgitation is mild . No evidence of tricuspid stenosis. Aortic Valve: The aortic valve is tricuspid. There is mild calcification of the aortic valve. Aortic valve regurgitation is trivial. Aortic valve sclerosis/calcification is present, without any evidence of aortic stenosis. Aortic valve mean gradient measures 6.0 mmHg. Aortic valve peak gradient measures 12.8 mmHg. Aortic valve area, by VTI measures 1.26 cm. Pulmonic Valve: The pulmonic valve was grossly normal. Pulmonic valve regurgitation is trivial. No evidence of pulmonic stenosis. Aorta: The aortic root is normal in size and structure. Venous: The inferior vena cava is normal in size with greater than 50% respiratory variability, suggesting right atrial pressure of 3 mmHg. IAS/Shunts: No atrial level shunt detected by color flow Doppler.  LEFT VENTRICLE PLAX 2D LVIDd:         4.10 cm     Diastology LVIDs:         2.80 cm     LV e' medial:    13.50 cm/s LV PW:         1.20 cm     LV E/e' medial:  9.6 LV IVS:        0.70 cm     LV e' lateral:   13.40 cm/s LVOT diam:     1.50 cm     LV E/e' lateral: 9.6 LV SV:         47 LV SV Index:   27 LVOT Area:     1.77 cm  LV Volumes (MOD) LV vol d, MOD A2C: 61.0 ml LV vol d, MOD A4C: 78.6 ml LV vol s, MOD A2C: 20.5 ml LV vol s, MOD A4C: 18.7 ml LV SV MOD A2C:     40.5 ml LV SV MOD A4C:     78.6 ml LV SV MOD BP:      50.2 ml RIGHT VENTRICLE             IVC RV S prime:     13.80 cm/s  IVC diam: 1.70 cm TAPSE (M-mode): 2.7 cm LEFT ATRIUM             Index        RIGHT ATRIUM           Index LA diam:        3.60 cm 2.05 cm/m   RA Area:     12.80 cm LA Vol (A2C):   43.7 ml 24.85 ml/m  RA Volume:   31.40 ml  17.86 ml/m LA Vol (A4C):   30.2 ml 17.17 ml/m LA  Biplane Vol: 38.5 ml 21.90 ml/m  AORTIC VALVE                     PULMONIC VALVE AV Area (Vmax):    1.15 cm      PV Vmax:       0.82  m/s AV Area (Vmean):   1.11 cm      PV Vmean:      50.200 cm/s AV Area (VTI):     1.26 cm      PV VTI:        0.167 m AV Vmax:           179.00 cm/s   PV Peak grad:  2.7 mmHg AV Vmean:          116.000 cm/s  PV Mean grad:  1.0 mmHg AV VTI:            0.377 m AV Peak Grad:      12.8 mmHg AV Mean Grad:      6.0 mmHg LVOT Vmax:         116.00 cm/s LVOT Vmean:        72.800 cm/s LVOT VTI:          0.268 m LVOT/AV VTI ratio: 0.71  AORTA Ao Root diam: 2.60 cm Ao Asc diam:  3.20 cm MITRAL VALVE                TRICUSPID VALVE MV Area (PHT): 1.72 cm     TR Peak grad:   23.4 mmHg MV Decel Time: 440 msec     TR Vmax:        242.00 cm/s MV E velocity: 129.00 cm/s MV A velocity: 94.00 cm/s   SHUNTS MV E/A ratio:  1.37         Systemic VTI:  0.27 m                             Systemic Diam: 1.50 cm Glori Bickers MD Electronically signed by Glori Bickers MD Signature Date/Time: 10/30/2021/5:36:10 PM    Final     Microbiology: Results for orders placed or performed during the hospital encounter of 10/29/21  Resp Panel by RT-PCR (Flu A&B, Covid) Nasopharyngeal Swab     Status: None   Collection Time: 10/29/21  8:03 AM   Specimen: Nasopharyngeal Swab; Nasopharyngeal(NP) swabs in vial transport medium  Result Value Ref Range Status   SARS Coronavirus 2 by RT PCR NEGATIVE NEGATIVE Final    Comment: (NOTE) SARS-CoV-2 target nucleic acids are NOT DETECTED.  The SARS-CoV-2 RNA is generally detectable in upper respiratory specimens during the acute phase of infection. The lowest concentration of SARS-CoV-2 viral copies this assay can detect is 138 copies/mL. A negative result does not preclude SARS-Cov-2 infection and should not be used as the sole basis for treatment or other patient management decisions. A negative result may occur with  improper specimen collection/handling, submission of specimen other than nasopharyngeal swab, presence of viral mutation(s) within the areas targeted by this assay, and  inadequate number of viral copies(<138 copies/mL). A negative result must be combined with clinical observations, patient history, and epidemiological information. The expected result is Negative.  Fact Sheet for Patients:  EntrepreneurPulse.com.au  Fact Sheet for Healthcare Providers:  IncredibleEmployment.be  This test is no t yet approved or cleared by the [place] FDA and  has been authorized for detection and/or diagnosis of SARS-CoV-2 by FDA  under an Emergency Use Authorization (EUA). This EUA will remain  in effect (meaning this test can be used) for the duration of the COVID-19 declaration under Section 564(b)(1) of the Act, 21 U.S.C.section 360bbb-3(b)(1), unless the authorization is terminated  or revoked sooner.       Influenza A by PCR NEGATIVE NEGATIVE Final   Influenza B by PCR NEGATIVE NEGATIVE Final    Comment: (NOTE) The Xpert Xpress SARS-CoV-2/FLU/RSV plus assay is intended as an aid in the diagnosis of influenza from Nasopharyngeal swab specimens and should not be used as a sole basis for treatment. Nasal washings and aspirates are unacceptable for Xpert Xpress SARS-CoV-2/FLU/RSV testing.  Fact Sheet for Patients: EntrepreneurPulse.com.au  Fact Sheet for Healthcare Providers: IncredibleEmployment.be  This test is not yet approved or cleared by the [place] FDA and has been authorized for detection and/or diagnosis of SARS-CoV-2 by FDA under an Emergency Use Authorization (EUA). This EUA will remain in effect (meaning this test can be used) for the duration of the COVID-19 declaration under Section 564(b)(1) of the Act, 21 U.S.C. section 360bbb-3(b)(1), unless the authorization is terminated or revoked.  Performed at [facility], [address] [place], [place] [address]   Blood Culture (routine x 2)     Status: None (Preliminary result)   Collection  Time: 10/29/21  8:38 AM   Specimen: BLOOD RIGHT ARM  Result Value Ref Range Status   Specimen Description   Final    BLOOD RIGHT ARM Performed at [facility], [address] [place], [place] [address]    Special Requests   Final    BOTTLES DRAWN AEROBIC AND ANAEROBIC Blood Culture adequate volume Performed at [facility], [address] [place], [place] [address]    Culture   Final    NO GROWTH 3 DAYS Performed at [facility], [place] [address] [place], [place] [address]    Report Status PENDING  Incomplete  Blood Culture (routine x 2)     Status: None (Preliminary result)   Collection Time: 10/29/21  8:38 AM   Specimen: BLOOD LEFT ARM  Result Value Ref Range Status   Specimen Description   Final    BLOOD LEFT ARM Performed at [facility], [address] [place], [place] [address]    Special Requests   Final    BOTTLES DRAWN AEROBIC AND ANAEROBIC Blood Culture adequate volume Performed at [facility], [address], [place], [place] [address]    Culture   Final    NO GROWTH 3 DAYS Performed at [facility], [place] [address], [place], [place] [address]    Report Status PENDING  Incomplete  Urine Culture     Status: None   Collection Time: 10/29/21  8:45 AM   Specimen: In/Out Cath Urine  Result Value Ref Range Status   Specimen Description   Final    IN/OUT CATH URINE Performed at [facility], [address] [place], [place] [address]    Special Requests   Final    NONE Performed at [facility], [address], [place], [place] [address]    Culture   Final    NO GROWTH Performed at [facility], [place] [address] [place],  [address]    Report Status 10/30/2021 FINAL  Final  MRSA Next Gen by PCR, Nasal     Status: None   Collection Time: 10/31/21  5:30 PM   Specimen: Nasal Mucosa; Nasal  Swab  Result Value Ref Range  Status   MRSA by PCR Next Gen NOT DETECTED NOT DETECTED Final    Comment: (NOTE) The GeneXpert MRSA Assay (FDA approved for NASAL specimens only), is one component of a comprehensive MRSA colonization surveillance program. It is not intended to diagnose MRSA infection nor to guide or monitor treatment for MRSA infections. Test performance is not FDA approved in patients less than 12 years old. Performed at [facility], [place] [address] [place], [place] [address]     Labs: CBC: Recent Labs  Lab 10/29/21 [phone] 10/30/21 0516 10/31/21 0450 11/01/21 0525  WBC 16.6* 12.1* 12.4* 12.9*  NEUTROABS 12.0*  --   --   --   HGB 10.9* 11.0* 11.0* 11.6*  HCT 33.9* 34.9* 35.2* 36.6  MCV 80.3 83.7 84.4 83.2  PLT 492* 460* 490* 209*   Basic Metabolic Panel: Recent Labs  Lab 10/29/21 0803 10/30/21 0516 10/31/21 0450 11/01/21 0525  NA 144 140 136 137  K 3.1* 4.5 4.0 4.4  CL 105 107 100 100  CO2 27 26 28 27   GLUCOSE 84 143* 89 95  BUN 11 12 10 10   CREATININE 0.71 0.69 0.73 0.76  CALCIUM 9.0 8.1* 8.3* 8.6*  MG 2.2  --   --   --   PHOS 3.4  --   --   --    Liver Function Tests: Recent Labs  Lab 10/29/21 0803 10/30/21 0516 10/31/21 0450 11/01/21 0525  AST 43* 56* 97* 77*  ALT 39 63* 126* 142*  ALKPHOS 98 106 101 104  BILITOT 0.3 0.2* 0.3 0.3  PROT 7.1 6.7 6.9 7.0  ALBUMIN 3.4* 2.7* 2.9* 3.0*   CBG: No results for input(s): GLUCAP in the last 168 hours.  Discharge time spent: less than 30 minutes.  Signed: Marylu Lund, MD [facility] 11/01/2021

## 2021-11-01 NOTE — Plan of Care (Signed)
°  Problem: Respiratory: Goal: Ability to maintain adequate ventilation will improve Outcome: Progressing Goal: Ability to maintain a clear airway will improve Outcome: Progressing

## 2021-11-01 NOTE — Progress Notes (Signed)
SATURATION QUALIFICATIONS: (This note is used to comply with regulatory documentation for home oxygen)  Patient Saturations on Room Air at Rest = 95%  Patient Saturations on Room Air while Ambulating = 93-95%  Patient Saturations on 0 Liters of oxygen while Ambulating = 93-95%  Please briefly explain why patient needs home oxygen: Pt saturation dropped twice down to 86%, but after short rest and deep breathing pt recovered to 95%. Karina Wilkinson

## 2021-11-03 LAB — CULTURE, BLOOD (ROUTINE X 2)
Culture: NO GROWTH
Culture: NO GROWTH
Special Requests: ADEQUATE
Special Requests: ADEQUATE

## 2021-11-05 ENCOUNTER — Other Ambulatory Visit (HOSPITAL_COMMUNITY): Payer: Self-pay

## 2021-11-25 ENCOUNTER — Other Ambulatory Visit: Payer: Self-pay

## 2021-11-25 ENCOUNTER — Encounter: Payer: Self-pay | Admitting: Pulmonary Disease

## 2021-11-25 ENCOUNTER — Ambulatory Visit: Payer: BC Managed Care – PPO | Admitting: Pulmonary Disease

## 2021-11-25 VITALS — BP 116/52 | HR 70 | Temp 98.0°F | Ht 62.5 in | Wt 161.0 lb

## 2021-11-25 DIAGNOSIS — G4733 Obstructive sleep apnea (adult) (pediatric): Secondary | ICD-10-CM | POA: Diagnosis not present

## 2021-11-25 DIAGNOSIS — J189 Pneumonia, unspecified organism: Secondary | ICD-10-CM | POA: Diagnosis not present

## 2021-11-25 DIAGNOSIS — R0602 Shortness of breath: Secondary | ICD-10-CM

## 2021-11-25 LAB — CBC WITH DIFFERENTIAL/PLATELET
Basophils Absolute: 0.1 10*3/uL (ref 0.0–0.1)
Basophils Relative: 1.3 % (ref 0.0–3.0)
Eosinophils Absolute: 0.2 10*3/uL (ref 0.0–0.7)
Eosinophils Relative: 3.4 % (ref 0.0–5.0)
HCT: 40.6 % (ref 36.0–46.0)
Hemoglobin: 13.3 g/dL (ref 12.0–15.0)
Lymphocytes Relative: 28.3 % (ref 12.0–46.0)
Lymphs Abs: 1.4 10*3/uL (ref 0.7–4.0)
MCHC: 32.8 g/dL (ref 30.0–36.0)
MCV: 82.5 fl (ref 78.0–100.0)
Monocytes Absolute: 0.4 10*3/uL (ref 0.1–1.0)
Monocytes Relative: 8.6 % (ref 3.0–12.0)
Neutro Abs: 3 10*3/uL (ref 1.4–7.7)
Neutrophils Relative %: 58.4 % (ref 43.0–77.0)
Platelets: 233 10*3/uL (ref 150.0–400.0)
RBC: 4.93 Mil/uL (ref 3.87–5.11)
RDW: 15.8 % — ABNORMAL HIGH (ref 11.5–15.5)
WBC: 5.1 10*3/uL (ref 4.0–10.5)

## 2021-11-25 MED ORDER — ALBUTEROL SULFATE HFA 108 (90 BASE) MCG/ACT IN AERS: 2.0000 | INHALATION_SPRAY | Freq: Four times a day (QID) | RESPIRATORY_TRACT | 5 refills | Status: DC | PRN

## 2021-11-25 MED ORDER — BENZONATATE 100 MG PO CAPS: 100.0000 mg | ORAL_CAPSULE | Freq: Three times a day (TID) | ORAL | 1 refills | Status: DC | PRN

## 2021-11-25 NOTE — Addendum Note (Signed)
Addended by: Elton Sin on: 11/25/2021 12:28 PM ? ? Modules accepted: Orders ? ?

## 2021-11-25 NOTE — Progress Notes (Signed)
? ?      ?Karina Wilkinson    [id number]    [birth year] ? ?Primary Care Physician:Collins, Hinton Dyer, DO ? ?Referring Physician: Janie Morning, DO ?[address] ?[address] ?[place],  [place] [address] ? ?Chief complaint: Post hospitalization for multifocal pneumonia.  ? ?HPI: ?61 year old with seasonal allergies, asthma, anxiety, hyperlipidemia.  She had COVID-19 infection in January 2023 but did not require hospitalization.  She recovered from that but had worsening dyspnea and was admitted in February 2023 with multifocal pneumonia.  Treated with Solu-Medrol, Rocephin and doxycycline and DuoNebs ? ?Post hospitalization she is making slow recovery but has occasional cough. ?Noted to have loud snoring and witnessed apneas during the hospitalization and she wants to be tested for sleep apnea. ? ?Pets: Dog ?Occupation: Retired Chief Technology Officer ?Exposures: No mold, hot tub, Jacuzzi.  No feather pillows or comforters ?Smoking history: Never smoker ?Travel history: No significant travel history ?Relevant family history: No family history of lung disease ? ?Outpatient Encounter Medications as of 11/25/2021  ?Medication Sig  ? albuterol (VENTOLIN HFA) 108 (90 Base) MCG/ACT inhaler Inhale 2 puffs into the lungs every 6 (six) hours as needed for wheezing or shortness of breath.  ? benzonatate (TESSALON) 100 MG capsule Take 1 capsule (100 mg total) by mouth 3 (three) times daily as needed for cough.  ? buPROPion (WELLBUTRIN XL) 150 MG 24 hr tablet Take 150 mg by mouth every morning.  ? busPIRone (BUSPAR) 10 MG tablet Take 10 mg by mouth 3 (three) times daily.  ? citalopram (CELEXA) 20 MG tablet Take 60 mg by mouth daily.  ? fluticasone (FLONASE) 50 MCG/ACT nasal spray Place 1 spray into both nostrils daily.  ? HYDROcodone bit-homatropine (HYCODAN) 5-1.5 MG/5ML syrup Take 5 mLs by mouth every 6 (six) hours as needed.  ? hydroxychloroquine (PLAQUENIL) 200 MG tablet Take 400 mg by mouth daily.  ? rosuvastatin (CRESTOR) 10  MG tablet Take 10 mg by mouth daily.  ? Vitamin D, Ergocalciferol, (DRISDOL) 50000 units CAPS capsule Take 50,000 Units by mouth every 7 (seven) days.  ? pantoprazole (PROTONIX) 40 MG tablet Take 1 tablet (40 mg total) by mouth daily. (Patient not taking: Reported on 11/25/2021)  ? [DISCONTINUED] COVID-19 mRNA Vac-TriS, Pfizer, (PFIZER-BIONT COVID-19 VAC-TRIS) SUSP injection Inject into the muscle.  ? [DISCONTINUED] predniSONE (DELTASONE) 10 MG tablet Take 10 mg by mouth daily with breakfast.  ? ?No facility-administered encounter medications on file as of 11/25/2021.  ? ? ?Allergies as of 11/25/2021  ? (No Known Allergies)  ? ? ?Past Medical History:  ?Diagnosis Date  ? Allergy   ? Anal fissure   ? Anxiety   ? Arthritis   ? Asthma   ? Depression   ? Fibromyalgia   ? Gestational diabetes   ? Heart murmur   ? HLD (hyperlipidemia)   ? Overweight (BMI 25.0-29.9) 10/29/2021  ? ? ?Past Surgical History:  ?Procedure Laterality Date  ? FOOT SURGERY Right 2000  ? low back surgery    ? [place] SURGERY  2008  ? plantar fasciitis surgery    ? ? ?Family History  ?Problem Relation Age of Onset  ? Hypertension Mother   ? Peptic Ulcer Mother   ? Hypertension Father   ? Cancer Father   ? Heart disease Father   ? Hyperlipidemia Father   ? Stroke Father   ? Mental illness Maternal Grandmother   ? Stroke Maternal Grandmother   ? Hypertension Maternal Grandmother   ? Diabetes Paternal Grandmother   ?  Ovarian cancer Paternal Grandmother   ? Irritable bowel syndrome Sister   ? Ovarian cancer Paternal Aunt   ? Ulcerative colitis Daughter   ? ? ?Social History  ? ?Socioeconomic History  ? Marital status: Divorced  ?  Spouse name: Not on file  ? Number of children: 2  ? Years of education: Not on file  ? Highest education level: Not on file  ?Occupational History  ? Occupation: retired Agricultural engineer  ?Tobacco Use  ? Smoking status: Former  ?  Types: Cigarettes  ?  Quit date: 12  ?  Years since quitting: 38.2  ? Smokeless tobacco:  Never  ?Substance and Sexual Activity  ? Alcohol use: Yes  ?  Alcohol/week: 3.0 standard drinks  ?  Types: 3 Glasses of wine per week  ?  Comment: 0-2 per day  ? Drug use: No  ? Sexual activity: Not on file  ?Other Topics Concern  ? Not on file  ?Social History Narrative  ? Not on file  ? ?Social Determinants of Health  ? ?Financial Resource Strain: Not on file  ?Food Insecurity: Not on file  ?Transportation Needs: Not on file  ?Physical Activity: Not on file  ?Stress: Not on file  ?Social Connections: Not on file  ?Intimate Partner Violence: Not on file  ? ? ?Review of systems: ?Review of Systems  ?Constitutional: Negative for fever and chills.  ?HENT: Negative.   ?Eyes: Negative for blurred vision.  ?Respiratory: as per HPI  ?Cardiovascular: Negative for chest pain and palpitations.  ?Gastrointestinal: Negative for vomiting, diarrhea, blood per rectum. ?Genitourinary: Negative for dysuria, urgency, frequency and hematuria.  ?Musculoskeletal: Negative for myalgias, back pain and joint pain.  ?Skin: Negative for itching and rash.  ?Neurological: Negative for dizziness, tremors, focal weakness, seizures and loss of consciousness.  ?Endo/Heme/Allergies: Negative for environmental allergies.  ?Psychiatric/Behavioral: Negative for depression, suicidal ideas and hallucinations.  ?All other systems reviewed and are negative. ? ?Physical Exam: ?Blood pressure (!) 116/52, pulse 70, temperature 98 ?F (36.7 ?C), temperature source Oral, height 5' 2.5" (1.588 m), weight 161 lb (73 kg), SpO2 97 %. ?Gen:      No acute distress ?HEENT:  EOMI, sclera anicteric ?Neck:     No masses; no thyromegaly ?Lungs:    Clear to auscultation bilaterally; normal respiratory effort ?CV:         Regular rate and rhythm; no murmurs ?Abd:      + bowel sounds; soft, non-tender; no palpable masses, no distension ?Ext:    No edema; adequate peripheral perfusion ?Skin:      Warm and dry; no rash ?Neuro: alert and oriented x 3 ?Psych: normal mood and  affect ? ?Data Reviewed: ?Imaging: ?CTA 10/29/2021-bilateral multifocal airspace disease right greater than left.  I have reviewed the images personally. ? ?PFTs: ? ? ?Labs: ? ?Cardiac: ?Echocardiogram 10/30/2021 ?LVEF 60 to 97%, RV systolic size and function is normal. ? ?Assessment:  ?Post hospitalization for pneumonia ?She had COVID in January and hospitalization for multilobar bacterial pneumonia in February.  Overall she is making good recovery but has occasional cough ? ?Get CT chest in 3 months to ensure resolution of infiltrates ? ?Asthma ?Mild asthma.  Using albuterol as needed.  No need for controller medication ?Check CBC with differential and IgE ? ?Suspected OSA ?Has Snoring and apneas.  Order home sleep study. ? ?Plan/Recommendations: ?Home sleep study ?CBC differential, IgE ?Continue albuterol, Tessalon ?High-res CT in 3 months ? ?Marshell Garfinkel MD ?[facility] ?11/25/2021, 11:19 AM ? ?CC: Janie Morning, DO ? ?  ?

## 2021-11-25 NOTE — Patient Instructions (Signed)
We will order home sleep study for evaluation of sleep apnea ?Renew albuterol and tessalon ?Order high-res CT in 3 months and follow-up in 3 months ?

## 2021-11-26 LAB — IGE: IgE (Immunoglobulin E), Serum: 6 kU/L (ref <=114)

## 2021-12-10 ENCOUNTER — Institutional Professional Consult (permissible substitution): Payer: BC Managed Care – PPO | Admitting: Internal Medicine

## 2022-02-15 ENCOUNTER — Ambulatory Visit: Payer: BC Managed Care – PPO

## 2022-02-15 DIAGNOSIS — G4733 Obstructive sleep apnea (adult) (pediatric): Secondary | ICD-10-CM | POA: Diagnosis not present

## 2022-02-16 DIAGNOSIS — G4733 Obstructive sleep apnea (adult) (pediatric): Secondary | ICD-10-CM

## 2022-02-23 ENCOUNTER — Telehealth: Payer: Self-pay | Admitting: Pulmonary Disease

## 2022-02-24 NOTE — Telephone Encounter (Signed)
Called and spoke with patient regarding the results of her sleep study.  I advised her that the study had not been read yet and once we have that information from the physician, the nurse will call her with the results/recommendations.  She verbalized understanding.  Routing to Dr. Vaughan Browner.  Dr. Vaughan Browner, patient is calling regarding results of sleep study.  Please advise.  Thank you.

## 2022-02-25 ENCOUNTER — Ambulatory Visit (INDEPENDENT_AMBULATORY_CARE_PROVIDER_SITE_OTHER)
Admission: RE | Admit: 2022-02-25 | Discharge: 2022-02-25 | Disposition: A | Discharge status: Home / Self Care | Payer: BC Managed Care – PPO | Source: Ambulatory Visit | Attending: Pulmonary Disease | Admitting: Pulmonary Disease

## 2022-02-25 DIAGNOSIS — R0602 Shortness of breath: Secondary | ICD-10-CM

## 2022-02-26 ENCOUNTER — Encounter: Payer: Self-pay | Admitting: Acute Care

## 2022-02-26 ENCOUNTER — Telehealth: Payer: Self-pay | Admitting: Acute Care

## 2022-02-26 ENCOUNTER — Ambulatory Visit: Payer: BC Managed Care – PPO | Admitting: Acute Care

## 2022-02-26 VITALS — BP 118/72 | HR 81 | Temp 98.0°F | Ht 62.5 in | Wt 169.2 lb

## 2022-02-26 DIAGNOSIS — J479 Bronchiectasis, uncomplicated: Secondary | ICD-10-CM

## 2022-02-26 DIAGNOSIS — G4733 Obstructive sleep apnea (adult) (pediatric): Secondary | ICD-10-CM

## 2022-02-26 DIAGNOSIS — Z7689 Persons encountering health services in other specified circumstances: Secondary | ICD-10-CM

## 2022-03-01 NOTE — Telephone Encounter (Signed)
Orders have been placed for sputum cultures and flutter valve. Called and spoke with patient. She is aware to come by the office today to pick up her cups and flutter valve. Once she gets here, I will demonstrate to her how to use the flutter valve.   Nothing further needed at time of call.

## 2022-03-01 NOTE — Telephone Encounter (Signed)
Sleep study results were discussed at office visit.

## 2022-03-02 ENCOUNTER — Telehealth: Payer: Self-pay | Admitting: Acute Care

## 2022-03-02 DIAGNOSIS — G4733 Obstructive sleep apnea (adult) (pediatric): Secondary | ICD-10-CM

## 2022-03-04 NOTE — Telephone Encounter (Signed)
Called patient but she did not answer. Left message for her to call back.  

## 2022-03-04 NOTE — Telephone Encounter (Signed)
Called patient and she states that she wants to discuss nebulized medications and vest therapy. She states she has done research and would like to know what sarah thinks.   And the referral is still pending as of 6/29.   Sarah please advise

## 2022-03-04 NOTE — Telephone Encounter (Signed)
For her referral

## 2022-03-05 NOTE — Telephone Encounter (Signed)
Spoke with pt and reviewed Sarah's recommendations. This RN instructed pt to increase flutter valve usage to several times a day and give the combo 1 more week to work. Pt stated understanding. Pt did want to let Judson Roch know that she did want to have a C Pap ordered for her. Judson Roch may we have the setting along with placing a C Pap order for this pt.

## 2022-03-05 NOTE — Telephone Encounter (Signed)
Spoke with pt who confirmed wanting to move forward with the C Pap. C Pap order was placed and pt informed DME company will reach out to her to provide instruction on using  and set up of C Pap. Pt is aware she will need to be seen in OV 31- 90 days after start of therapy. Placed order for C Pap

## 2022-03-11 ENCOUNTER — Telehealth: Payer: Self-pay | Admitting: Acute Care

## 2022-03-11 NOTE — Telephone Encounter (Signed)
Called and spoke with patient who states that she has been feeling sick for 2-3. Has not checked for a fever. States she was with granchildren and her grandson is positive for strep as of today. They have not tested her granddaughter as of yet but she has an appt today too. Is not sure if they were tested for Covid. She has not had a strep test herself. Patient states that she has congestion, cough and sore throat. Wonders if she should take her antibiotic or wait as she doesnt want to mess up her sputum samples. Advised patient to do a home covid test and call us back with the results. She expressed understanding. Will wait for patient to call back, she said it may be tomorrow.

## 2022-03-12 ENCOUNTER — Encounter: Payer: Self-pay | Admitting: Acute Care

## 2022-03-12 ENCOUNTER — Ambulatory Visit (INDEPENDENT_AMBULATORY_CARE_PROVIDER_SITE_OTHER): Payer: BC Managed Care – PPO | Admitting: Acute Care

## 2022-03-12 VITALS — BP 126/80 | HR 78 | Temp 97.9°F | Ht 62.5 in | Wt 169.6 lb

## 2022-03-12 DIAGNOSIS — Z20818 Contact with and (suspected) exposure to other bacterial communicable diseases: Secondary | ICD-10-CM

## 2022-03-12 DIAGNOSIS — I4902 Ventricular flutter: Secondary | ICD-10-CM | POA: Diagnosis not present

## 2022-03-12 DIAGNOSIS — R002 Palpitations: Secondary | ICD-10-CM

## 2022-03-12 NOTE — Progress Notes (Signed)
History of Present Illness Karina Wilkinson is a 61 y.o. female former smoker with seasonal allergies, asthma, anxiety, hyperlipidemia and moderate sleep apnea.. She is followed by Dr.Mannam  Synopsis Karina Wilkinson is a 61 y.o. female with seasonal allergies, asthma, anxiety, hyperlipidemia.  She had COVID-19 infection in January 2023 but did not require hospitalization.  She recovered from that but had worsening dyspnea and was admitted in February 2023 with multifocal pneumonia.  Treated with Solu-Medrol, Rocephin and doxycycline and DuoNebs   Post hospitalization she made a  slow recovery but has occasional cough. Noted to have loud snoring and witnessed apneas during the hospitalization and she was  tested for sleep apnea per Dr. Vaughan Browner. Her home sleep study was + for Moderate OSA. She initially wanted to be referred for an oral appliance, but has subsequently decided on CPAP therapy with nasal pillows. This order was placed  03/02/2022.   03/12/2022 Pt. Presents for acute visit . She has been taking care of her grandchildren and her grandson was   diagnosed with Strep Throat. Her grand daughter is also sick with runny stuffy nose. She has not been diagnosed with strep. Pt. States Tuesday she developed a stuffy runny nose, + post nasal drip. Headache, sore throat and cough. She called her PCP yesterday 7/6 , and they phoned in an Amoxicillin. She did not want to take that until she checked with [place]. She is also complaining of a heart fluttering feeling today. Looking at her old EKG's,  she had a slightly prolonged QTc in 2/23 when she was hospitalized for pneumonia. She was taking Celexa to 60 mg daily, and this was decreased to 40 mg daily as there was concern about EKG changes. We will do an EKG today. She denies fever, chest pain, orthopnea or hemoptysis. She is in no distress today.   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   -  Nonspecific T-abnormality.   Low voltage -possible pulmonary disease.   Referred to Cardiology 03/12/2022>> EKG reviewed with Dr. Silas Flood 03/12/2022   EKG 10/29/2021 Vent. rate 81 BPM PR interval 163 ms QRS duration 99 ms QT/QTcB 423/491 ms P-R-T axes 59 16 51 Sinus rhythm Probable left atrial enlargement Low voltage, precordial leads Borderline prolonged QT interval Confirmed by Lacretia Leigh ([id number])     Latest Ref Rng & Units 11/25/2021   11:51 AM 11/01/2021    5:25 AM 10/31/2021    4:50 AM  CBC  WBC 4.0 - 10.5 K/uL 5.1  12.9  12.4   Hemoglobin 12.0 - 15.0 g/dL 13.3  11.6  11.0   Hematocrit 36.0 - 46.0 % 40.6  36.6  35.2   Platelets 150.0 - 400.0 K/uL 233.0  519  490        Latest Ref Rng & Units 11/01/2021    5:25 AM 10/31/2021    4:50 AM 10/30/2021    5:16 AM  BMP  Glucose 70 - 99 mg/dL 95  89  143   BUN 8 - 23 mg/dL '10  10  12   '$ Creatinine 0.44 - 1.00 mg/dL 0.76  0.73  0.69   Sodium 135 - 145 mmol/L 137  136  140   Potassium 3.5 - 5.1 mmol/L 4.4  4.0  4.5   Chloride 98 - 111 mmol/L 100  100  107   CO2  22 - 32 mmol/L '27  28  26   '$ Calcium 8.9 - 10.3 mg/dL 8.6  8.3  8.1     BNP    Component Value Date/Time   BNP 478.0 (H) 10/29/2021 0803    ProBNP No results found for: "PROBNP"  PFT No results found for: "FEV1PRE", "FEV1POST", "FVCPRE", "FVCPOST", "TLC", "DLCOUNC", "PREFEV1FVCRT", "PSTFEV1FVCRT"  CT CHEST HIGH RESOLUTION  Result Date: 02/26/2022 CLINICAL DATA:  Dyspnea.  History of pneumonia. EXAM: CT CHEST WITHOUT CONTRAST TECHNIQUE: Multidetector CT imaging of the chest was performed following the standard protocol without intravenous contrast. High resolution imaging of the lungs, as well as inspiratory and expiratory imaging, was performed. RADIATION DOSE REDUCTION: This exam was performed according to the departmental dose-optimization program which includes automated exposure control, adjustment  of the mA and/or kV according to patient size and/or use of iterative reconstruction technique. COMPARISON:  10/29/2021 chest CT angiogram. FINDINGS: Cardiovascular: Normal heart size. No significant pericardial effusion/thickening. Atherosclerotic nonaneurysmal thoracic aorta. Normal caliber pulmonary arteries. Mediastinum/Nodes: No discrete thyroid nodules. Unremarkable esophagus. No pathologically enlarged axillary, mediastinal or hilar lymph nodes, noting limited sensitivity for the detection of hilar adenopathy on this noncontrast study. Lungs/Pleura: No pneumothorax. No pleural effusion. No acute consolidative airspace disease or lung masses. Widespread mild-to-moderate cylindrical bronchiectasis in both lungs with associated patchy mucoid impaction and mild tree-in-bud opacity, most prominent in the right middle lobe and peripheral basilar right lower lobe. Scattered thin parenchymal bands at the areas of bronchiectasis. The regions of consolidation throughout both lungs on the prior scan have resolved. No significant lobular air trapping or evidence of tracheobronchomalacia on the expiration sequence. No significant regions of subpleural reticulation, ground-glass opacity or frank honeycombing. No significant pulmonary nodules. Upper abdomen: No acute abnormality. Musculoskeletal: No aggressive appearing focal osseous lesions. Mild thoracic spondylosis. IMPRESSION: 1. Widespread mild-to-moderate cylindrical bronchiectasis in both lungs with associated patchy mucoid impaction and mild tree-in-bud opacity, most prominent in the right middle lobe and peripheral basilar right lower lobe. Atypical mycobacterial infection (MAI) not excluded. 2. No evidence of interstitial lung disease. 3. Aortic Atherosclerosis (ICD10-I70.0). Electronically Signed   By: Ilona Sorrel M.D.   On: 02/26/2022 11:15     Past medical hx Past Medical History:  Diagnosis Date   Allergy    Anal fissure    Anxiety    Arthritis     Asthma    Depression    Fibromyalgia    Gestational diabetes    Heart murmur    HLD (hyperlipidemia)    Overweight (BMI 25.0-29.9) 10/29/2021     Social History   Tobacco Use   Smoking status: Former    Types: Cigarettes    Quit date: 1985    Years since quitting: 38.5   Smokeless tobacco: Never  Substance Use Topics   Alcohol use: Yes    Alcohol/week: 3.0 standard drinks of alcohol    Types: 3 Glasses of wine per week    Comment: 0-2 per day   Drug use: No    Ms.Alverson reports that she quit smoking about 38 years ago. Her smoking use included cigarettes. She has never used smokeless tobacco. She reports current alcohol use of about 3.0 standard drinks of alcohol per week. She reports that she does not use drugs.  Tobacco Cessation: Former smoker quit 1985 with a remote smoking history   Past surgical hx, Family hx, Social hx all reviewed.  Current Outpatient Medications on File Prior to Visit  Medication Sig   albuterol (VENTOLIN HFA)  108 (90 Base) MCG/ACT inhaler Inhale 2 puffs into the lungs every 6 (six) hours as needed for wheezing or shortness of breath.   benzonatate (TESSALON) 100 MG capsule Take 1 capsule (100 mg total) by mouth 3 (three) times daily as needed for cough.   buPROPion (WELLBUTRIN XL) 150 MG 24 hr tablet Take 150 mg by mouth every morning.   busPIRone (BUSPAR) 10 MG tablet Take 10 mg by mouth 3 (three) times daily.   citalopram (CELEXA) 20 MG tablet Take 60 mg by mouth daily.   fluticasone (FLONASE) 50 MCG/ACT nasal spray Place 1 spray into both nostrils daily.   hydroxychloroquine (PLAQUENIL) 200 MG tablet Take 400 mg by mouth daily.   rosuvastatin (CRESTOR) 10 MG tablet Take 10 mg by mouth daily.   Vitamin D, Ergocalciferol, (DRISDOL) 50000 units CAPS capsule Take 50,000 Units by mouth every 7 (seven) days.   amoxicillin (AMOXIL) 875 MG tablet Take 875 mg by mouth 2 (two) times daily. (Patient not taking: Reported on 03/12/2022)   No current  facility-administered medications on file prior to visit.     No Known Allergies  Review Of Systems:  Constitutional:   No  weight loss, night sweats,  Fevers, chills, fatigue, or  lassitude.  HEENT:   + headaches,  Difficulty swallowing,  Tooth/dental problems, or  Sore throat,                No sneezing, itching, ear ache, + nasal congestion, + post nasal drip,   CV:  No chest pain,  Orthopnea, PND, swelling in lower extremities, anasarca, dizziness, palpitations, syncope. + fluttering of the heart  GI  No heartburn, indigestion, abdominal pain, nausea, vomiting, diarrhea, change in bowel habits, loss of appetite, bloody stools.   Resp: No shortness of breath with exertion or at rest.  + excess mucus, no productive cough,  + non-productive cough,  No coughing up of blood.  No change in color of mucus.  No wheezing.  No chest wall deformity  Skin: no rash or lesions.  GU: no dysuria, change in color of urine, no urgency or frequency.  No flank pain, no hematuria   MS:  No joint pain or swelling.  No decreased range of motion.  No back pain.  Psych:  No change in mood or affect. No depression or anxiety.  No memory loss.   Vital Signs BP 126/80 (BP Location: Right Arm, Patient Position: Sitting, Cuff Size: Normal)   Pulse 78   Temp 97.9 F (36.6 C) (Oral)   Ht 5' 2.5" (1.588 m)   Wt 169 lb 9.6 oz (76.9 kg)   SpO2 95%   BMI 30.53 kg/m    Physical Exam:  General- No distress,  A&Ox3, pleasant  ENT: No sinus tenderness, TM clear, pale nasal mucosa, no oral exudate,no post nasal drip, no LAN Cardiac: S1, S2, regular rate and rhythm, no murmur Chest: No wheeze/ rales/ dullness; no accessory muscle use, no nasal flaring, no sternal retractions, + non-productive Cough Abd.: Soft Non-tender, ND, BS +, Body mass index is 30.53 kg/m.  Ext: No clubbing cyanosis, edema Neuro:  normal strength, MAE x 4, A&O x 3, appropriate Skin: No rashes, warm and dry Psych: normal mood and  behavior   Assessment/Plan Exposure to Strep Throat via grandchildren New cough and sore throat Seasonal allergies Chest fluttering Plan Take the Amoxicillin that your PCP prescribed for you 03/11/2021.  One tablet twice daily x 10 days. Take probiotic daily with antibiotic. ( Culturelle)  Resume your Flonase nasal spray while you are symptomatic.  For cough:  Sips of water instead of throat clearing Sugar Free [employer] or [employer] originals for throat soothing. Tessalon Perles as needed for cough Delsym Cough syrup 5 cc's every 12 hours as needed for breakthrough coughing  Non-sedating antihistamine of your choice daily ( Zyrtec, Allegra, Xyzol, Claritin ( Generic ok)  I will make sure we let the DME know you want nasal pillows with your CPAP machine . We will do an EKG today for the  heart fluttering. We will refer you to cardiology due to your family history and history of upper limit normal QTc, and recent heart fluttering  Follow up 6-8 weeks after you get your CPAP machine. Follow up as needed if you need [place] sooner.  Please contact office for sooner follow up if symptoms do not improve or worsen or seek emergency care    I spent 50 minutes dedicated to the care of this patient on the date of this encounter to include pre-visit review of records, face-to-face time with the patient discussing conditions above, post visit ordering of testing, clinical documentation with the electronic health record, making appropriate referrals as documented, and communicating necessary information to the patient's healthcare team.   Magdalen Spatz, NP 03/12/2022  11:16 AM

## 2022-03-12 NOTE — Telephone Encounter (Signed)
Spoke with pt who states still having sore throat and what feels like a "bad head cold," as well as dry cough which has been going on for last couple of days. Pt confirmed negative home covid test and denies fever. Pt scheduled with acute OV with Judson Roch. Nothing further needed at this time.   Routing to [employer] as Juluis Rainier

## 2022-03-12 NOTE — Patient Instructions (Addendum)
It is good to see you today. Take the Amoxicillin that your PCP prescribed for you 03/11/2021.  One tablet twice daily x 10 days. Take probiotic daily with antibiotic. ( Culturelle)  Resume your Flonase nasal spray while you are symptomatic.  For cough:  Sips of water instead of throat clearing Sugar Free [employer] or [employer] originals for throat soothing. Tessalon Perles as needed for cough Delsym Cough syrup 5 cc's every 12 hours as needed for breakthrough coughing  Non-sedating antihistamine of your choice daily ( Zyrtec, Allegra, Xyzol, Claritin ( Generic ok)  I will make sure we let the DME know you want nasal pillows with your CPAP machine . We will do an EKG today for the  heart fluttering. We will refer you to cardiology due to your family history and history of upper limit normal QTc, and recent heart fluttering  Follow up 6-8 weeks after you get your CPAP machine. Follow up as needed if you need [place] sooner.  Please contact office for sooner follow up if symptoms do not improve or worsen or seek emergency care

## 2022-04-05 ENCOUNTER — Ambulatory Visit: Payer: BC Managed Care – PPO | Admitting: Internal Medicine

## 2022-04-05 ENCOUNTER — Encounter: Payer: Self-pay | Admitting: Internal Medicine

## 2022-04-05 VITALS — BP 128/82 | HR 72 | Ht 62.5 in | Wt 170.0 lb

## 2022-04-05 DIAGNOSIS — R002 Palpitations: Secondary | ICD-10-CM | POA: Diagnosis not present

## 2022-04-05 DIAGNOSIS — R9431 Abnormal electrocardiogram [ECG] [EKG]: Secondary | ICD-10-CM | POA: Diagnosis not present

## 2022-04-05 NOTE — Patient Instructions (Addendum)
Medication Instructions:  Your physician recommends that you continue on your current medications as directed. Please refer to the Current Medication list given to you today.  *If you need a refill on your cardiac medications before your next appointment, please call your pharmacy*   Lab Work: None ordered.  If you have labs (blood work) drawn today and your tests are completely normal, you will receive your results only by: [place] (if you have MyChart) OR A paper copy in the mail If you have any lab test that is abnormal or we need to change your treatment, we will call you to review the results.   Testing/Procedures: Dr Caryl Comes would like for you to wear a 30 day cardiac event monitor - Our monitor tech will contact you about this.    Follow-Up: At [facility], you and your health needs are our priority.  As part of our continuing mission to provide you with exceptional heart care, we have created designated Provider Care Teams.  These Care Teams include your primary Cardiologist (physician) and Advanced Practice Providers (APPs -  Physician Assistants and Nurse Practitioners) who all work together to provide you with the care you need, when you need it.  We recommend signing up for the patient portal called "MyChart".  Sign up information is provided on this After Visit Summary.  MyChart is used to connect with patients for Virtual Visits (Telemedicine).  Patients are able to view lab/test results, encounter notes, upcoming appointments, etc.  Non-urgent messages can be sent to your provider as well.   To learn more about what you can do with MyChart, go to [email or web address].    Your next appointment:   Follow up with Dr Caryl Comes as needed  Important Information About Sugar

## 2022-04-05 NOTE — Progress Notes (Signed)
ELECTROPHYSIOLOGY CONSULT NOTE  Patient ID: Karina Wilkinson, MRN: [id number], DOB/AGE: 1960/10/02 61 y.o. Admit date: (Not on file) Date of Consult: 04/05/2022  Primary Physician: Janie Morning, DO Primary Cardiologist: new     Karina Wilkinson is a 61 y.o. female who is being seen today for the evaluation of abnormal ECCg at the request of SGroce NP.    HPI Karina Wilkinson is a 61 y.o. female who referred because of an ECG demonstrating QT prolongation 2/23.  At that time she was hospitalized for progressive pneumonia symptoms having been started on azithromycin as an outpatient.  She takes hydroxychloroquine and citalopram at baseline; her QT was noted to be long.  Azithromycin was discontinued the citalopram dose was reduced.  The next electrocardiogram was obtained in the pulmonary office 7/23 and was normal.  She has no history of syncope.  She does have occasional palpitations occurring a couple times a week associated with some lightheadedness and occasional shortness of breath.  Lasting often is much as a minute.  Lung disease with high-resolution CT 6/23 demonstrating bronchiectasis-mild-moderate, and the question of atypical mycobacterial infection was raised   DATE TEST EF   2/23 Echo  60-65%         Date Cr K Hgb  3/23 0.76 4.4 13.3<11.0         OSA mod   Past Medical History:  Diagnosis Date   Allergy    Anal fissure    Anxiety    Arthritis    Asthma    Depression    Fibromyalgia    Gestational diabetes    Heart murmur    HLD (hyperlipidemia)    Overweight (BMI 25.0-29.9) 10/29/2021      Surgical History:  Past Surgical History:  Procedure Laterality Date   FOOT SURGERY Right 2000   low back surgery     LUMBAR DISC SURGERY  2008   plantar fasciitis surgery       Home Meds: Current Meds  Medication Sig   albuterol (VENTOLIN HFA) 108 (90 Base) MCG/ACT inhaler Inhale 2 puffs into the lungs every 6 (six) hours as needed for wheezing or shortness of  breath.   benzonatate (TESSALON) 100 MG capsule Take 1 capsule (100 mg total) by mouth 3 (three) times daily as needed for cough.   buPROPion (WELLBUTRIN XL) 150 MG 24 hr tablet Take 150 mg by mouth every morning.   busPIRone (BUSPAR) 10 MG tablet Take 10 mg by mouth as needed.   fluticasone (FLONASE) 50 MCG/ACT nasal spray Place 1 spray into both nostrils daily.   hydroxychloroquine (PLAQUENIL) 200 MG tablet Take 400 mg by mouth daily.   loratadine (CLARITIN) 10 MG tablet Take 10 mg by mouth daily as needed for allergies.   rosuvastatin (CRESTOR) 10 MG tablet Take 10 mg by mouth daily.   Vitamin D, Ergocalciferol, (DRISDOL) 50000 units CAPS capsule Take 50,000 Units by mouth every 7 (seven) days.    Allergies: No Known Allergies  Social History   Socioeconomic History   Marital status: Divorced    Spouse name: Not on file   Number of children: 2   Years of education: Not on file   Highest education level: Not on file  Occupational History   Occupation: retired Educational psychologist ED Teacher  Tobacco Use   Smoking status: Former    Types: Cigarettes    Quit date: 1985    Years since quitting: 38.6   Smokeless tobacco: Never  Substance  and Sexual Activity   Alcohol use: Yes    Alcohol/week: 3.0 standard drinks of alcohol    Types: 3 Glasses of wine per week    Comment: 0-2 per day   Drug use: No   Sexual activity: Not on file  Other Topics Concern   Not on file  Social History Narrative   Not on file   Social Determinants of Health   Financial Resource Strain: Not on file  Food Insecurity: Not on file  Transportation Needs: Not on file  Physical Activity: Not on file  Stress: Not on file  Social Connections: Not on file  Intimate Partner Violence: Not on file     Family History  Problem Relation Age of Onset   Hypertension Mother    Peptic Ulcer Mother    Hypertension Father    Cancer Father    Heart disease Father    Hyperlipidemia Father    Stroke Father    Mental  illness Maternal Grandmother    Stroke Maternal Grandmother    Hypertension Maternal Grandmother    Diabetes Paternal Grandmother    Ovarian cancer Paternal Grandmother    Irritable bowel syndrome Sister    Ovarian cancer Paternal Aunt    Ulcerative colitis Daughter      ROS:  Please see the history of present illness.     All other systems reviewed and negative.    Physical Exam: Blood pressure 128/82, pulse 72, height 5' 2.5" (1.588 m), weight 170 lb (77.1 kg). General: Well developed, well nourished female in no acute distress. Head: Normocephalic, atraumatic, sclera non-icteric, no xanthomas, nares are without discharge. EENT: normal  Lymph Nodes:  none Neck: Negative for carotid bruits. JVD not elevated. Back:without scoliosis kyphosis Lungs: Clear bilaterally to auscultation without wheezes, rales, or rhonchi. Breathing is unlabored. Heart: RRR with S1 S2. No murmur . No rubs, or gallops appreciated. Abdomen: Soft, non-tender, non-distended with normoactive bowel sounds. No hepatomegaly. No rebound/guarding. No obvious abdominal masses. Msk:  Strength and tone appear normal for age. Extremities: No clubbing or cyanosis. No  edema.  Distal pedal pulses are 2+ and equal bilaterally. Skin: Warm and Dry Neuro: Alert and oriented X 3. CN III-XII intact Grossly normal sensory and motor function . Psych:  Responds to questions appropriately with a normal affect.        EKG: 7/31  QTc 430 ECG 7/1 [place] Karina Wilkinson also repeat/23 QTc 420 ECG 2/23 QTc 491  Assessment and Plan:  Drug induced long QT syndrome  Palpitations  Depression  Bronchiectasis  Hydroxychoroquine therapy   The patient was noted, correctly, to have QT prolongation with a QTc of about 490 which on subsequent tracings, 7/23 and then again today had normalized subsequent to the discontinuation of the azithromycin, and this not withstanding the ongoing therapy with citalopram and hydroxychloroquine.  I suspect  that she has a form fruste long QT, with inadequate repolarization reserve which was then exceeded with the addition of the azithromycin to her background citalopram and hydroxychloroquine all 3 of which can block the I[place] channel.   At this point I have suggested that she continue her citalopram and hydroxychloroquine; if the doses can be reduced that is great.  I have also given her the website credible meds.org and have asked her to make sure that her physicians and other caregivers do not give her medications that are potentially torsade agenic and would be found on the credible meds.org list.  She has palpitations that are relatively bothersome  and somewhat frequently.  We will undertake a 30-day event recorder; these palpitations are not consistent with torsade de pointes  Virl Axe

## 2022-04-12 NOTE — Telephone Encounter (Signed)
Please Inform Patient That there is a concern about immodium and QT prolongation, typically at many more tahn 5 doses a day. I dont know aobut a safety threshold so will have to look into this further  Thanks

## 2022-04-15 ENCOUNTER — Institutional Professional Consult (permissible substitution): Payer: BC Managed Care – PPO | Admitting: Internal Medicine

## 2022-04-17 ENCOUNTER — Ambulatory Visit: Payer: BC Managed Care – PPO | Attending: Internal Medicine

## 2022-04-17 DIAGNOSIS — R9431 Abnormal electrocardiogram [ECG] [EKG]: Secondary | ICD-10-CM

## 2022-04-17 DIAGNOSIS — R002 Palpitations: Secondary | ICD-10-CM

## 2022-05-17 ENCOUNTER — Telehealth: Payer: Self-pay | Admitting: Internal Medicine

## 2022-05-17 NOTE — Telephone Encounter (Signed)
Spoke with pt who states she has been unable to wear the monitor the entire time due to an allergic reaction to the monitor adhesive.  Pt states she just received the strips today for children but she has been without wearing the monitor for 2 weeks.  Pt is calling for advisement on how to proceed as company is requesting she return current equipment.  Pt advised will forward to [facility]. Monitor tech to further assist.  Pt verbalizes understanding and will await contact for further direction.

## 2022-05-17 NOTE — Telephone Encounter (Signed)
Patient calling in bout the heart monitor. Please advise

## 2022-05-18 ENCOUNTER — Telehealth: Payer: Self-pay | Admitting: *Deleted

## 2022-05-18 NOTE — Telephone Encounter (Signed)
Patient started [employer] 30 day cardiac event monitor on 04/17/22.  She has sensitive skin and had a reaction, called [employer] and they sent her alternative electrodes.  Patient had reaction to replacement.  Travelled to [place] to [employer].  Called [employer] for 3rd option, requested to be shipped to [place].  [employer] shipped to her home address.  She did get home yesterday and wants to know if she can get extension for time she had not worn monitor from 05/06/22.  Explained 30 day monitor is not cumulative time she has worn the monitor, it is 30 days from the time she started the monitor.  I called [employer] and was able to get the standard 5 day extension from her end of service which was 05/16/2022.  Her last day of service will be 05/21/2022.    If Dr. Caryl Comes requires more data, a second monitor will need to be ordered, but patient should take into consideration, difficulty she has had with multiple electrode choices.

## 2022-05-19 NOTE — Telephone Encounter (Signed)
See 05/18/22 telephone note.

## 2022-05-20 ENCOUNTER — Telehealth: Payer: Self-pay | Admitting: *Deleted

## 2022-05-20 NOTE — Telephone Encounter (Signed)
Patient states she did not hear back about extension. Informed her [employer] granted the standard 5 day extension from the last day of service, which was 05/16/22.  Patients final day of service will now be 05/21/22.   Asked patient to take note to see if final selection of electrodes works for her.  If they do she needs to write down the brand/specifics, so if monitor ordered in future, we will know what to request.

## 2022-05-24 ENCOUNTER — Telehealth: Payer: Self-pay | Admitting: Acute Care

## 2022-05-24 DIAGNOSIS — R634 Abnormal weight loss: Secondary | ICD-10-CM

## 2022-05-26 NOTE — Telephone Encounter (Signed)
Called and spoke to patient and advised her that the referral has been placed. Patient voiced understanding. Nothing further needed

## 2022-05-26 NOTE — Telephone Encounter (Signed)
Called patient and she want to get information in regards to weight loss clinic that was discussed at last office visit.  Please advise

## 2022-07-01 ENCOUNTER — Ambulatory Visit (INDEPENDENT_AMBULATORY_CARE_PROVIDER_SITE_OTHER): Payer: BC Managed Care – PPO | Admitting: Internal Medicine

## 2022-07-01 ENCOUNTER — Encounter (INDEPENDENT_AMBULATORY_CARE_PROVIDER_SITE_OTHER): Payer: Self-pay | Admitting: Internal Medicine

## 2022-07-01 VITALS — BP 125/76 | HR 85 | Temp 98.0°F | Ht 62.5 in | Wt 172.8 lb

## 2022-07-01 DIAGNOSIS — E785 Hyperlipidemia, unspecified: Secondary | ICD-10-CM

## 2022-07-01 DIAGNOSIS — Z6831 Body mass index (BMI) 31.0-31.9, adult: Secondary | ICD-10-CM | POA: Diagnosis not present

## 2022-07-01 DIAGNOSIS — Z0289 Encounter for other administrative examinations: Secondary | ICD-10-CM

## 2022-07-01 DIAGNOSIS — E669 Obesity, unspecified: Secondary | ICD-10-CM | POA: Diagnosis not present

## 2022-07-01 DIAGNOSIS — G4733 Obstructive sleep apnea (adult) (pediatric): Secondary | ICD-10-CM

## 2022-07-01 NOTE — Progress Notes (Signed)
Office: [phone]  /  Fax: [phone]   Initial Visit  Karina Wilkinson was seen in clinic today to evaluate for obesity. She is interested in losing weight to improve overall health and reduce the risk of weight related complications. She presents today to review program treatment options, initial physical assessment, and evaluation.     She was referred by: Specialist  When asked what else they would like to accomplish? She states: Adopt healthier eating patterns, Improve existing medical conditions, Improve quality of life, and Improve self-confidence  When asked how has your weight affected you? She states: Contributed to medical problems, Having fatigue, Having poor endurance, and Problems with eating patterns  Some associated conditions: Hyperlipidemia and OSA  Contributing factors: Family history, Disruption of circadian rhythm, Reduced physical activity, Eating patterns, and Menopause  Weight promoting medications identified: None  Current nutrition plan: Portion control / smart choices  Current level of physical activity: Walking  Current or previous pharmacotherapy: GLP-1  Response to medication: Lost weight initially but was unable to sustain weight loss   Past medical history includes:   Past Medical History:  Diagnosis Date   Allergy    Anal fissure    Anxiety    Arthritis    Asthma    Depression    Fibromyalgia    Gestational diabetes    Heart murmur    HLD (hyperlipidemia)    Overweight (BMI 25.0-29.9) 10/29/2021     Objective:   BP 125/76   Pulse 85   Temp 98 F (36.7 C)   Ht 5' 2.5" (1.588 m)   Wt 172 lb 12.8 oz (78.4 kg)   SpO2 96%   BMI 31.10 kg/m  She was weighed on the bioimpedance scale: Body mass index is 31.1 kg/m.  Peak Weight: 170,Visceral Fat Rating: 11, Body Fat%: 39.7, Weight trend over the last 12 months: Unchanged  General:  Alert, oriented and cooperative. Patient is in no acute distress.  Respiratory: Normal respiratory  effort, no problems with respiration noted  Extremities: Normal range of motion.    Mental Status: Normal mood and affect. Normal behavior. Normal judgment and thought content.   Assessment and Plan:  1. Class 1 obesity without serious comorbidity with body mass index (BMI) of 31.0 to 31.9 in adult, unspecified obesity type We reviewed weight, biometrics, associated medical conditions and contributing factors with patient. She would benefit from weight loss therapy via a modified calorie, low-carb, high-protein nutritional plan tailored to their REE (resting energy expenditure) which will be determined by indirect calorimetry.  We will also assess for cardiometabolic risk and nutritional derangements via fasting serologies at her next appointment.   2. OSA on CPAP Relatively new diagnosis.  Patient describes it as mild.  She is on CPAP with good compliance.  Her goal is to lose weight to come off CPAP.  This will be in the order of about 15% of total body weight.  3. Hyperlipidemia, unspecified hyperlipidemia type Cannot find any recent labs.  She is currently on rosuvastatin 10 mg a day.  She denies any medication side effects.  We will check a lipid panel and calculate cardiovascular risk at her future appointment.        Obesity Treatment / Action Plan:  Patient will work on garnering support from family and friends to begin weight loss journey. Will work on eliminating or reducing the presence of highly palatable, calorie dense foods in the home. Will complete provided nutritional and psychosocial assessment questionnaire before the  next appointment. Will be scheduled for indirect calorimetry to determine resting energy expenditure in a fasting state.  This will allow [place] to create a reduced calorie, high-protein meal plan to promote loss of fat mass while preserving muscle mass. Will think about ideas on how to incorporate physical activity into their daily routine. Will work on  improving sleep hygiene and trying to obtain at least 7 hours of sleep. Was counseled on nutritional approaches to weight loss and benefits of complex carbs and high quality protein as part of nutritional weight management. Was counseled on pharmacotherapy and role as an adjunct in weight management.   Obesity Education Performed Today:  She was weighed on the bioimpedance scale and results were discussed and documented in the synopsis.  We discussed obesity as a disease and the importance of a more detailed evaluation of all the factors contributing to the disease.  We discussed the importance of long term lifestyle changes which include nutrition, exercise and behavioral modifications as well as the importance of customizing this to her specific health and social needs.  We discussed the benefits of reaching a healthier weight to alleviate the symptoms of existing conditions and reduce the risks of the biomechanical, metabolic and psychological effects of obesity.  [facility] appears to be in the action stage of change and states they are ready to start intensive lifestyle modifications and behavioral modifications.  30 minutes was spent today on this visit including the above counseling, pre-visit chart review, and post-visit documentation.  Reviewed by clinician on day of visit: allergies, medications, problem list, medical history, surgical history, family history, social history, and previous encounter notes.     I have reviewed the above documentation for accuracy and completeness, and I agree with the above.  Thomes Dinning, MD

## 2022-07-13 ENCOUNTER — Ambulatory Visit (INDEPENDENT_AMBULATORY_CARE_PROVIDER_SITE_OTHER): Payer: BC Managed Care – PPO | Admitting: Internal Medicine

## 2022-07-13 ENCOUNTER — Encounter (INDEPENDENT_AMBULATORY_CARE_PROVIDER_SITE_OTHER): Payer: Self-pay | Admitting: Internal Medicine

## 2022-07-13 VITALS — BP 125/68 | HR 82 | Temp 98.2°F | Ht 62.0 in | Wt 174.0 lb

## 2022-07-13 DIAGNOSIS — E7849 Other hyperlipidemia: Secondary | ICD-10-CM

## 2022-07-13 DIAGNOSIS — R5383 Other fatigue: Secondary | ICD-10-CM | POA: Diagnosis not present

## 2022-07-13 DIAGNOSIS — E669 Obesity, unspecified: Secondary | ICD-10-CM

## 2022-07-13 DIAGNOSIS — G4733 Obstructive sleep apnea (adult) (pediatric): Secondary | ICD-10-CM | POA: Diagnosis not present

## 2022-07-13 DIAGNOSIS — E559 Vitamin D deficiency, unspecified: Secondary | ICD-10-CM | POA: Diagnosis not present

## 2022-07-13 DIAGNOSIS — R0602 Shortness of breath: Secondary | ICD-10-CM | POA: Diagnosis not present

## 2022-07-13 DIAGNOSIS — Z1331 Encounter for screening for depression: Secondary | ICD-10-CM | POA: Diagnosis not present

## 2022-07-13 DIAGNOSIS — Z6831 Body mass index (BMI) 31.0-31.9, adult: Secondary | ICD-10-CM

## 2022-07-14 LAB — LIPID PANEL WITH LDL/HDL RATIO
Cholesterol, Total: 225 mg/dL — ABNORMAL HIGH (ref 100–199)
HDL: 91 mg/dL (ref >39)
LDL Chol Calc (NIH): 119 mg/dL — ABNORMAL HIGH (ref 0–99)
LDL/HDL Ratio: 1.3 ratio (ref 0.0–3.2)
Triglycerides: 89 mg/dL (ref 0–149)
VLDL Cholesterol Cal: 15 mg/dL (ref 5–40)

## 2022-07-14 LAB — COMPREHENSIVE METABOLIC PANEL
ALT: 18 IU/L (ref 0–32)
AST: 17 IU/L (ref 0–40)
Albumin/Globulin Ratio: 1.7 (ref 1.2–2.2)
Albumin: 4.5 g/dL (ref 3.9–4.9)
Alkaline Phosphatase: 83 IU/L (ref 44–121)
BUN/Creatinine Ratio: 24 (ref 12–28)
BUN: 18 mg/dL (ref 8–27)
Bilirubin Total: 0.5 mg/dL (ref 0.0–1.2)
CO2: 22 mmol/L (ref 20–29)
Calcium: 9.4 mg/dL (ref 8.7–10.3)
Chloride: 100 mmol/L (ref 96–106)
Creatinine, Ser: 0.74 mg/dL (ref 0.57–1.00)
Globulin, Total: 2.6 g/dL (ref 1.5–4.5)
Glucose: 86 mg/dL (ref 70–99)
Potassium: 4.6 mmol/L (ref 3.5–5.2)
Sodium: 138 mmol/L (ref 134–144)
Total Protein: 7.1 g/dL (ref 6.0–8.5)
eGFR: 91 mL/min/{1.73_m2} (ref >59)

## 2022-07-14 LAB — HEMOGLOBIN A1C
Est. average glucose Bld gHb Est-mCnc: 114 mg/dL
Hgb A1c MFr Bld: 5.6 % (ref 4.8–5.6)

## 2022-07-14 LAB — CBC WITH DIFFERENTIAL/PLATELET
Basophils Absolute: 0.1 10*3/uL (ref 0.0–0.2)
Basos: 1 %
EOS (ABSOLUTE): 0.1 10*3/uL (ref 0.0–0.4)
Eos: 2 %
Hematocrit: 43.2 % (ref 34.0–46.6)
Hemoglobin: 14 g/dL (ref 11.1–15.9)
Immature Grans (Abs): 0 10*3/uL (ref 0.0–0.1)
Immature Granulocytes: 0 %
Lymphocytes Absolute: 1.3 10*3/uL (ref 0.7–3.1)
Lymphs: 27 %
MCH: 27.1 pg (ref 26.6–33.0)
MCHC: 32.4 g/dL (ref 31.5–35.7)
MCV: 84 fL (ref 79–97)
Monocytes Absolute: 0.5 10*3/uL (ref 0.1–0.9)
Monocytes: 10 %
Neutrophils Absolute: 2.7 10*3/uL (ref 1.4–7.0)
Neutrophils: 60 %
Platelets: 270 10*3/uL (ref 150–450)
RBC: 5.17 x10E6/uL (ref 3.77–5.28)
RDW: 13.1 % (ref 11.7–15.4)
WBC: 4.6 10*3/uL (ref 3.4–10.8)

## 2022-07-14 LAB — VITAMIN B12: Vitamin B-12: 397 pg/mL (ref 232–1245)

## 2022-07-14 LAB — TSH: TSH: 0.747 u[IU]/mL (ref 0.450–4.500)

## 2022-07-14 LAB — VITAMIN D 25 HYDROXY (VIT D DEFICIENCY, FRACTURES): Vit D, 25-Hydroxy: 27 ng/mL — ABNORMAL LOW (ref 30.0–100.0)

## 2022-07-14 LAB — INSULIN, RANDOM: INSULIN: 8.5 u[IU]/mL (ref 2.6–24.9)

## 2022-07-15 ENCOUNTER — Telehealth: Payer: Self-pay | Admitting: Internal Medicine

## 2022-07-15 NOTE — Telephone Encounter (Signed)
Patient had a procedure done and she thinks the medication sped up her heart a little bit. She has another procedure that she has to get done and want to discuss things with the dr/nurse. Also she would like to know the name of the condition that she has with Dr. Caryl Comes. Please advise

## 2022-07-15 NOTE — Telephone Encounter (Signed)
Pt aware Dr. Caryl Comes and his nurse are not in the office today. Aware MD is out of the country and will not return until 11/17.  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 She is having another surgery on 11/28. Concerned episode will occur again and if there is anything MD recommends to avoid this.  Also informed that I am not sure she is diagnosed w/ LQT or not. Aware that Rosann Auerbach, RN may follow up with her to gain more information, but that she will not be able to discuss more with Dr. Caryl Comes until his return, but goal will be to answer before her next procedure.  Patient verbalized understanding and agreeable to plan.

## 2022-07-27 ENCOUNTER — Ambulatory Visit (INDEPENDENT_AMBULATORY_CARE_PROVIDER_SITE_OTHER): Payer: BC Managed Care – PPO | Admitting: Acute Care

## 2022-07-27 ENCOUNTER — Encounter: Payer: Self-pay | Admitting: Acute Care

## 2022-07-27 VITALS — BP 114/66 | HR 78 | Temp 98.0°F | Ht 62.0 in | Wt 176.6 lb

## 2022-07-27 DIAGNOSIS — Z23 Encounter for immunization: Secondary | ICD-10-CM | POA: Diagnosis not present

## 2022-07-27 DIAGNOSIS — J4520 Mild intermittent asthma, uncomplicated: Secondary | ICD-10-CM

## 2022-07-27 DIAGNOSIS — J479 Bronchiectasis, uncomplicated: Secondary | ICD-10-CM | POA: Diagnosis not present

## 2022-07-27 DIAGNOSIS — G4733 Obstructive sleep apnea (adult) (pediatric): Secondary | ICD-10-CM | POA: Diagnosis not present

## 2022-07-27 NOTE — Patient Instructions (Addendum)
It is good to see you today.  Your CPAP Down Load shows your sleep apnea is really well controlled  This is great. Keep up the good work. Consider daily Mucinex 1200 and Flutter valve as maintenance for your bronchiectasis. Increase frequency for chest congestion. Follow up with healthy weight and wellness.  Flu vaccine today. Make a vaccine appointment at the pharmacy in 2 weeks for the pneumococcal and the then after that RSV.  Use your albuterol inhaler as needed for shortness of breath or wheezing.  We will wait until 6 weeks after antibiotic to do sputum cultures, or just wait until the next time you have a productive cough We will place an order for new supplies for your CPAP machine.  Continue on CPAP at bedtime. You appear to be benefiting from the treatment  Goal is to wear for at least 6 hours each night for maximal clinical benefit. Continue to work on weight loss, as the link between excess weight  and sleep apnea is well established.   Remember to establish a good bedtime routine, and work on sleep hygiene.  Limit daytime naps , avoid stimulants such as caffeine and nicotine close to bedtime, exercise daily to promote sleep quality, avoid heavy , spicy, fried , or rich foods before bed. Ensure adequate exposure to natural light during the day,establish a relaxing bedtime routine with a pleasant sleep environment ( Bedroom between 60 and 67 degrees, turn off bright lights , TV or device screens screens , consider black out curtains or white noise machines) Do not drive if sleepy. Remember to clean mask, tubing, filter, and reservoir once weekly with soapy water.  Follow up with Dr. Vaughan Browner or Judson Roch NP  In 3 months. or before as needed.  Please contact office for sooner follow up if symptoms do not improve or worsen or seek emergency care   Happy Thanksgiving

## 2022-07-27 NOTE — Progress Notes (Signed)
History of Present Illness Karina Wilkinson is a 61 y.o. female female former smoker with seasonal allergies, asthma, anxiety, hyperlipidemia, bronchiectasis  and moderate sleep apnea.. She is followed by Dr.Mannam .   Synopsis Karina Wilkinson is a 61 y.o. female with seasonal allergies, asthma, anxiety, hyperlipidemia.  She had COVID-19 infection in January 2023 but did not require hospitalization.  She recovered from that but had worsening dyspnea and was admitted in February 2023 with multifocal pneumonia.  Treated with Solu-Medrol, Rocephin and doxycycline and DuoNebs   Post hospitalization she made a  slow recovery but has occasional cough. Noted to have loud snoring and witnessed apneas during the hospitalization and she was  tested for sleep apnea per Dr. Vaughan Browner. Her home sleep study was + for Moderate OSA. She initially wanted to be referred for an oral appliance, but has subsequently decided on CPAP therapy with nasal pillows. She started therapy within the last 2 months.   07/27/2022 Pt presents for follow up and compliance with CPAP start. She states she has been doing well with her CPAP machine. Down Load shows 77% compliance . She is wearing her device > 4 hours. She is sleeping better, is feeling better rested and has less daytime sleepiness.   She has been to the healthy weight [facility] wellness clinic. She started 2 weeks ago . She has follow up in another week. She is committed to working hard.   Patient rarely uses her rescue inhaler. She denies any wheezing. No productive cough.She has had a healthy interval.   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   -  Nonspecific T-abnormality.   Low voltage -possible pulmonary disease.    Referred to Cardiology 03/12/2022>> EKG reviewed with Dr. Silas Flood 03/12/2022     EKG 10/29/2021 Vent. rate 81 BPM PR interval  163 ms QRS duration 99 ms QT/QTcB 423/491 ms P-R-T axes 59 16 51 Sinus rhythm Probable left atrial enlargement Low voltage, precordial leads Borderline prolonged QT interval Confirmed by Lacretia Leigh ([id number])     Latest Ref Rng & Units 07/13/2022   10:26 AM 11/25/2021   11:51 AM 11/01/2021    5:25 AM  CBC  WBC 3.4 - 10.8 x10E3/uL 4.6  5.1  12.9   Hemoglobin 11.1 - 15.9 g/dL 14.0  13.3  11.6   Hematocrit 34.0 - 46.6 % 43.2  40.6  36.6   Platelets 150 - 450 x10E3/uL 270  233.0  519        Latest Ref Rng & Units 07/13/2022   10:26 AM 11/01/2021    5:25 AM 10/31/2021    4:50 AM  BMP  Glucose 70 - 99 mg/dL 86  95  89   BUN 8 - 27 mg/dL '18  10  10   '$ Creatinine 0.57 - 1.00 mg/dL 0.74  0.76  0.73   BUN/Creat Ratio 12 - 28 24     Sodium 134 - 144 mmol/L 138  137  136   Potassium 3.5 - 5.2 mmol/L 4.6  4.4  4.0   Chloride 96 - 106 mmol/L 100  100  100   CO2 20 - 29 mmol/L '22  27  28   '$ Calcium 8.7 - 10.3 mg/dL 9.4  8.6  8.3     BNP    Component Value Date/Time   BNP 478.0 (H) 10/29/2021 6010  ProBNP No results found for: "PROBNP"  PFT No results found for: "FEV1PRE", "FEV1POST", "FVCPRE", "FVCPOST", "TLC", "DLCOUNC", "PREFEV1FVCRT", "PSTFEV1FVCRT"  No results found.   Past medical hx Past Medical History:  Diagnosis Date   Allergy    Anal fissure    Anxiety    Arthritis    Asthma    Atypical mycobacterial infection    Back pain    Chest pain    Cylindrical bronchiectasis (HCC)    Depression    Diarrhea    Fibromyalgia    Fibromyalgia    Gestational diabetes    Heart murmur    HLD (hyperlipidemia)    Joint pain    Osteoporosis    Overweight (BMI 25.0-29.9) 10/29/2021   Palpitations    Rheumatoid arthritis (HCC)    Sleep apnea    SOB (shortness of breath)    Vitamin D deficiency      Social History   Tobacco Use   Smoking status: Former    Types: Cigarettes    Quit date: 1985    Years since quitting: 38.9   Smokeless tobacco: Never  Substance  Use Topics   Alcohol use: Yes    Alcohol/week: 3.0 standard drinks of alcohol    Types: 3 Glasses of wine per week    Comment: 0-2 per day   Drug use: No    Ms.Karina Wilkinson reports that she quit smoking about 38 years ago. Her smoking use included cigarettes. She has never used smokeless tobacco. She reports current alcohol use of about 3.0 standard drinks of alcohol per week. She reports that she does not use drugs.  Tobacco Cessation: Former smoker , quit 1985, no documentation of pack year smoking history   Past surgical hx, Family hx, Social hx all reviewed.  Current Outpatient Medications on File Prior to Visit  Medication Sig   doxycycline (VIBRA-TABS) 100 MG tablet Take 100 mg by mouth 2 (two) times daily.   albuterol (VENTOLIN HFA) 108 (90 Base) MCG/ACT inhaler Inhale 2 puffs into the lungs every 6 (six) hours as needed for wheezing or shortness of breath. (Patient not taking: Reported on 07/13/2022)   benzonatate (TESSALON) 100 MG capsule Take 1 capsule (100 mg total) by mouth 3 (three) times daily as needed for cough. (Patient not taking: Reported on 07/13/2022)   buPROPion (WELLBUTRIN XL) 150 MG 24 hr tablet Take 150 mg by mouth every morning.   busPIRone (BUSPAR) 10 MG tablet Take 10 mg by mouth as needed.   citalopram (CELEXA) 20 MG tablet Take 60 mg by mouth daily.   fluticasone (FLONASE) 50 MCG/ACT nasal spray Place 1 spray into both nostrils daily.   hydroxychloroquine (PLAQUENIL) 200 MG tablet Take 400 mg by mouth daily.   loperamide (IMODIUM) 2 MG capsule Take 2 mg by mouth as needed for diarrhea or loose stools.   loratadine (CLARITIN) 10 MG tablet Take 10 mg by mouth daily as needed for allergies. (Patient not taking: Reported on 07/13/2022)   rosuvastatin (CRESTOR) 10 MG tablet Take 10 mg by mouth daily.   Vitamin D, Ergocalciferol, (DRISDOL) 50000 units CAPS capsule Take 50,000 Units by mouth every 7 (seven) days.   No current facility-administered medications on file prior  to visit.     No Known Allergies  Review Of Systems:  Constitutional:   No  weight loss, night sweats,  Fevers, chills, fatigue, or  lassitude.  HEENT:   No headaches,  Difficulty swallowing,  Tooth/dental problems, or  Sore throat,  No sneezing, itching, ear ache, nasal congestion, post nasal drip,   CV:  No chest pain,  Orthopnea, PND, swelling in lower extremities, anasarca, dizziness, palpitations, syncope.   GI  No heartburn, indigestion, abdominal pain, nausea, vomiting, diarrhea, change in bowel habits, loss of appetite, bloody stools.   Resp: No shortness of breath with exertion or at rest.  No excess mucus, no productive cough,  No non-productive cough,  No coughing up of blood.  No change in color of mucus.  No wheezing.  No chest wall deformity  Skin: no rash or lesions.  GU: no dysuria, change in color of urine, no urgency or frequency.  No flank pain, no hematuria   MS:  No joint pain or swelling.  No decreased range of motion.  No back pain.  Psych:  No change in mood or affect. No depression or anxiety.  No memory loss.   Vital Signs BP 114/66 (BP Location: Right Arm, Cuff Size: Normal)   Pulse 78   Temp 98 F (36.7 C) (Oral)   Ht '5\' 2"'$  (1.575 m)   Wt 176 lb 9.6 oz (80.1 kg)   SpO2 97%   BMI 32.30 kg/m    Physical Exam:  General- No distress,  A&Ox3, pleasant ENT: No sinus tenderness, TM clear, pale nasal mucosa, no oral exudate,no post nasal drip, no LAN Cardiac: S1, S2, regular rate and rhythm, no murmur Chest: No wheeze/ rales/ dullness; no accessory muscle use, no nasal flaring, no sternal retractions Abd.: Soft Non-tender, ND, BS +, Body mass index is 32.3 kg/m.  Ext: No clubbing cyanosis, edema Neuro:  normal strength, MAE x 4, A&O x 3 Skin: No rashes, warm and dry, no lesions  Psych: normal mood and behavior   Assessment/Plan OSA on CPAP Plan Your CPAP Down Load shows your sleep apnea is really well controlled  This is  great. Keep up the good work. We will place an order for new supplies for your CPAP machine.  Continue on CPAP at bedtime. You appear to be benefiting from the treatment  Goal is to wear for at least 6 hours each night for maximal clinical benefit. Continue to work on weight loss, as the link between excess weight  and sleep apnea is well established.   Remember to establish a good bedtime routine, and work on sleep hygiene.  Limit daytime naps , avoid stimulants such as caffeine and nicotine close to bedtime, exercise daily to promote sleep quality, avoid heavy , spicy, fried , or rich foods before bed. Ensure adequate exposure to natural light during the day,establish a relaxing bedtime routine with a pleasant sleep environment ( Bedroom between 60 and 67 degrees, turn off bright lights , TV or device screens screens , consider black out curtains or white noise machines) Do not drive if sleepy. Remember to clean mask, tubing, filter, and reservoir once weekly with soapy water.  Follow up with Dr. Vaughan Browner or Judson Roch NP  In 3 months. or before as needed.  Please contact office for sooner follow up if symptoms do not improve or worsen or seek emergency care   Happy Thanksgiving  Bronchiectasis Stable interval Plan Consider daily Mucinex 1200 and Flutter valve as maintenance for your bronchiectasis. Increase frequency for chest congestion. We will wait until 6 weeks after antibiotic to do sputum cultures, or just wait until the next time you have a productive cough  Asthma Stable interval  Rare use if albuterol  Plan Use your albuterol inhaler as needed  for shortness of breath or wheezing  Health Maintenance Plan Flu vaccine today Make a vaccine appointment at the pharmacy in 2 weeks for the pneumococcal and the then after that RSV vaccine .   I spent 40 minutes dedicated to the care of this patient on the date of this encounter to include pre-visit review of records, face-to-face time  with the patient discussing conditions above, post visit ordering of testing, clinical  documentation with the electronic health record, making appropriate referrals as documented, and communicating necessary information to the patient's healthcare team.     Magdalen Spatz, NP 07/27/2022  9:39 AM

## 2022-08-01 NOTE — Progress Notes (Unsigned)
Chief Complaint:   OBESITY Karina Wilkinson (MR# [id number]) is a 61 y.o. female who presents for evaluation and treatment of obesity and related comorbidities. Current BMI is Body mass index is 31.83 kg/m. Karina Wilkinson has been struggling with her weight for many years and has been unsuccessful in either losing weight, maintaining weight loss, or reaching her healthy weight goal.  Karina Wilkinson is currently in the action stage of change and ready to dedicate time achieving and maintaining a healthier weight. Karina Wilkinson is interested in becoming our patient and working on intensive lifestyle modifications including (but not limited to) diet and exercise for weight loss.  Karina Wilkinson's habits were reviewed today and are as follows: her desired weight loss is 44 lbs, she started gaining weight in 2014, her heaviest weight ever was 174 pounds, she has significant food cravings issues, she skips meals frequently, she is frequently drinking liquids with calories, she frequently makes poor food choices, she frequently eats larger portions than normal, and she struggles with emotional eating.  Depression Screen Karina Wilkinson's Food and Mood (modified PHQ-9) score was 17.  Subjective:   1. Other fatigue Karina Wilkinson admits to daytime somnolence and admits to waking up still tired. Patient has a history of symptoms of daytime fatigue and morning fatigue. Karina Wilkinson generally gets  5-7  hours of sleep per night, and states that she has poor sleep quality. Snoring is present. Apneic episodes are not present. Epworth Sleepiness Score is 12.   2. SOB (shortness of breath) on exertion Karina Wilkinson notes increasing shortness of breath with exercising and seems to be worsening over time with weight gain. She notes getting out of breath sooner with activity than she used to. This has gotten worse recently. Karina Wilkinson denies shortness of breath at rest or orthopnea.  3. OSA (obstructive sleep apnea) Karina Wilkinson wears CPAP with good compliance.  4. Other  hyperlipidemia She is on rosuvastatin with no side effects.  5. Vitamin D deficiency She is currently taking prescription vitamin D 50,000 IU each week. She denies nausea, vomiting or muscle weakness.  Assessment/Plan:   1. Other fatigue Karina Wilkinson does feel that her weight is causing her energy to be lower than it should be. Fatigue may be related to obesity, depression or many other causes. Labs will be ordered, and in the meanwhile, Karina Wilkinson will focus on self care including making healthy food choices, increasing physical activity and focusing on stress reduction.  Lab/Orders today or future: - Vitamin B12 - CBC with Differential/Platelet - Comprehensive metabolic panel - Hemoglobin A1c - Insulin, random - TSH - Lipid Panel With LDL/HDL Ratio  2. SOB (shortness of breath) on exertion Karina Wilkinson does feel that she gets out of breath more easily that she used to when she exercises. Karina Wilkinson's shortness of breath appears to be obesity related and exercise induced. She has agreed to work on weight loss and gradually increase exercise to treat her exercise induced shortness of breath. Will continue to monitor closely.  3. OSA (obstructive sleep apnea) Weight loss therapy at 15% will improve condition.  4. Other hyperlipidemia Continue statin therapy adequate for CV risk category.  Lab/Orders today or future: - TSH - Lipid Panel With LDL/HDL Ratio  5. Vitamin D deficiency Check Vit D level and replenish as needed for goal of 50-60.  Lab/Orders today or future: - VITAMIN D 25 Hydroxy (Vit-D Deficiency, Fractures)  6. Depression screen Karina Wilkinson had a positive depression screening. Depression is commonly associated with obesity and often results in emotional eating  behaviors. We will monitor this closely and work on CBT to help improve the non-hunger eating patterns. Referral to Psychology may be required if no improvement is seen as she continues in our clinic.  7. Class 1 obesity without serious  comorbidity with body mass index (BMI) of 31.0 to 31.9 in adult, unspecified obesity type Lab/Orders today or future: - Hemoglobin A1c - Insulin, random - TSH - VITAMIN D 25 Hydroxy (Vit-D Deficiency, Fractures) - Lipid Panel With LDL/HDL Ratio  Karina Wilkinson is currently in the action stage of change and her goal is to continue with weight loss efforts. I recommend Karina Wilkinson begin the structured treatment plan as follows:  She has agreed to following a lower carbohydrate, vegetable and lean protein rich diet plan.  Exercise goals: Consider beginning a walking program for goal of 150 minutes a week. For substantial health benefits, adults should do at least 150 minutes (2 hours and 30 minutes) a week of moderate-intensity, or 75 minutes (1 hour and 15 minutes) a week of vigorous-intensity aerobic physical activity, or an equivalent combination of moderate- and vigorous-intensity aerobic activity. Aerobic activity should be performed in episodes of at least 10 minutes, and preferably, it should be spread throughout the week.   Behavioral modification strategies: increasing lean protein intake, decreasing simple carbohydrates, increasing vegetables, increasing water intake, decreasing liquid calories, increasing high fiber foods, no skipping meals, meal planning and cooking strategies, better snacking choices, avoiding temptations, and planning for success.  She was informed of the importance of frequent follow-up visits to maximize her success with intensive lifestyle modifications for her multiple health conditions. She was informed we would discuss her lab results at her next visit unless there is a critical issue that needs to be addressed sooner. Karina Wilkinson agreed to keep her next visit at the agreed upon time to discuss these results.  Objective:   Blood pressure 125/68, pulse 82, temperature 98.2 F (36.8 C), height '5\' 2"'$  (1.575 m), weight 174 lb (78.9 kg). Body mass index is 31.83 kg/m.  EKG: Normal  sinus rhythm, rate 69.  Indirect Calorimeter completed today shows a VO2 of 231 and a REE of 1598.  Her calculated basal metabolic rate is 5188 thus her basal metabolic rate is better than expected.  General: Cooperative, alert, well developed, in no acute distress. HEENT: Conjunctivae and lids unremarkable. Cardiovascular: Regular rhythm.  Lungs: Normal work of breathing. Neurologic: No focal deficits.   Lab Results  Component Value Date   CREATININE 0.74 07/13/2022   BUN 18 07/13/2022   NA 138 07/13/2022   K 4.6 07/13/2022   CL 100 07/13/2022   CO2 22 07/13/2022   Lab Results  Component Value Date   ALT 18 07/13/2022   AST 17 07/13/2022   ALKPHOS 83 07/13/2022   BILITOT 0.5 07/13/2022   Lab Results  Component Value Date   HGBA1C 5.6 07/13/2022   Lab Results  Component Value Date   INSULIN 8.5 07/13/2022   Lab Results  Component Value Date   TSH 0.747 07/13/2022   Lab Results  Component Value Date   CHOL 225 (H) 07/13/2022   HDL 91 07/13/2022   LDLCALC 119 (H) 07/13/2022   TRIG 89 07/13/2022   Lab Results  Component Value Date   WBC 4.6 07/13/2022   HGB 14.0 07/13/2022   HCT 43.2 07/13/2022   MCV 84 07/13/2022   PLT 270 07/13/2022   Attestation Statements:   Reviewed by clinician on day of visit: allergies, medications, problem list, medical  history, surgical history, family history, social history, and previous encounter notes.  Time spent on visit including pre-visit chart review and post-visit charting and care was 40 minutes.   I, Kathlene November, BS, CMA, am acting as transcriptionist for Thomes Dinning, MD.  I have reviewed the above documentation for accuracy and completeness, and I agree with the above. - ***

## 2022-08-03 ENCOUNTER — Ambulatory Visit (INDEPENDENT_AMBULATORY_CARE_PROVIDER_SITE_OTHER): Payer: BC Managed Care – PPO | Admitting: Internal Medicine

## 2022-08-03 ENCOUNTER — Encounter (INDEPENDENT_AMBULATORY_CARE_PROVIDER_SITE_OTHER): Payer: Self-pay | Admitting: Internal Medicine

## 2022-08-03 VITALS — BP 122/72 | HR 70 | Temp 97.8°F | Ht 62.0 in | Wt 172.0 lb

## 2022-08-03 DIAGNOSIS — E559 Vitamin D deficiency, unspecified: Secondary | ICD-10-CM | POA: Diagnosis not present

## 2022-08-03 DIAGNOSIS — Z6831 Body mass index (BMI) 31.0-31.9, adult: Secondary | ICD-10-CM

## 2022-08-03 DIAGNOSIS — E7849 Other hyperlipidemia: Secondary | ICD-10-CM

## 2022-08-03 DIAGNOSIS — E669 Obesity, unspecified: Secondary | ICD-10-CM

## 2022-08-03 DIAGNOSIS — Z86010 Personal history of colonic polyps: Secondary | ICD-10-CM

## 2022-08-12 ENCOUNTER — Telehealth (INDEPENDENT_AMBULATORY_CARE_PROVIDER_SITE_OTHER): Payer: Self-pay | Admitting: Internal Medicine

## 2022-08-12 NOTE — Telephone Encounter (Signed)
Pt called stating that she is out of town, and needs all of the meal plans that Dr. Gerarda Fraction gave to her during the last appt sent by MyChart. Please call pt at [phone].

## 2022-08-16 NOTE — Progress Notes (Unsigned)
Chief Complaint:   OBESITY Karina Wilkinson is here to discuss her progress with her obesity treatment plan along with follow-up of her obesity related diagnoses. Karina Wilkinson is on following a lower carbohydrate, vegetable and lean protein rich diet plan and states she is following her eating plan approximately 75-80% of the time. Karina Wilkinson states she is not currently exercising.  Today's visit was #: 2 Starting weight: 174 lbs Starting date: 07/13/2022 Today's weight: 172 lbs Today's date: 08/03/2022 Total lbs lost to date: 2 Total lbs lost since last in-office visit: 2  Interim History: Pt had selected low carb plan initially, but would like to incorporate more carb options. She would like more structure. She has been trying to eat protein at every meal and reports satiety and satiation when able to. Pt denies abnormal cravings at present. She has not begun to increase physical activity.  Subjective:   1. Vitamin D deficiency Discussed labs with patient today. Vitamin D level 27. Deficiency associated with adiposity and may result in fatigue and leptin resistance. Adherence has been sub-optimal.  2. Other hyperlipidemia Discussed labs with patient today. LDL 119 and HDL 91. Pt is on rosuvastatin 10 mg without side effects. Normal A1c, fasting blood glucose, and insulin levels.  3. History of colon polyps Discussed labs with patient today. Serrated polyp in 2019. Pt is due to repeat colonoscopy next year. Up-to-date on mammogram and pap smear.  Assessment/Plan:   1. Vitamin D deficiency Reviewed treatment options. Pt will resume high dose Vit D prescribed by PCP for a goal level of 50-60.  2. Other hyperlipidemia Weight loss therapy. PCP may consider increasing satin dose.  3. History of colon polyps Follow up colonoscopy in 2024.  4. Obesity, current BMI 31.6 Karina Wilkinson is currently in the action stage of change. As such, her goal is to continue with weight loss efforts. She has agreed to  change to the Category 2 Plan with target calorie intake of 1200. Plan reviewed in detail.   Handout: Breakfast and Lunch Options   Exercise goals:  Think about incorporating physical activity to daily routine.  Behavioral modification strategies: increasing lean protein intake, increasing water intake, no skipping meals, meal planning and cooking strategies, avoiding temptations, and planning for success.  Karina Wilkinson has agreed to follow-up with our clinic in 2 weeks. She was informed of the importance of frequent follow-up visits to maximize her success with intensive lifestyle modifications for her multiple health conditions.   Objective:   Blood pressure 122/72, pulse 70, temperature 97.8 F (36.6 C), height '5\' 2"'$  (1.575 m), weight 172 lb (78 kg), SpO2 96 %. Body mass index is 31.46 kg/m.  General: Cooperative, alert, well developed, in no acute distress. HEENT: Conjunctivae and lids unremarkable. Cardiovascular: Regular rhythm.  Lungs: Normal work of breathing. Neurologic: No focal deficits.   Lab Results  Component Value Date   CREATININE 0.74 07/13/2022   BUN 18 07/13/2022   NA 138 07/13/2022   K 4.6 07/13/2022   CL 100 07/13/2022   CO2 22 07/13/2022   Lab Results  Component Value Date   ALT 18 07/13/2022   AST 17 07/13/2022   ALKPHOS 83 07/13/2022   BILITOT 0.5 07/13/2022   Lab Results  Component Value Date   HGBA1C 5.6 07/13/2022   Lab Results  Component Value Date   INSULIN 8.5 07/13/2022   Lab Results  Component Value Date   TSH 0.747 07/13/2022   Lab Results  Component Value Date   CHOL  225 (H) 07/13/2022   HDL 91 07/13/2022   LDLCALC 119 (H) 07/13/2022   TRIG 89 07/13/2022   Lab Results  Component Value Date   VD25OH 27.0 (L) 07/13/2022   Lab Results  Component Value Date   WBC 4.6 07/13/2022   HGB 14.0 07/13/2022   HCT 43.2 07/13/2022   MCV 84 07/13/2022   PLT 270 07/13/2022     Attestation Statements:   Reviewed by clinician on day  of visit: allergies, medications, problem list, medical history, surgical history, family history, social history, and previous encounter notes.  Time spent on visit including pre-visit chart review and post-visit care and charting was 40 minutes.   I, Kathlene November, BS, CMA, am acting as transcriptionist for Thomes Dinning, MD.  I have reviewed the above documentation for accuracy and completeness, and I agree with the above. -Thomes Dinning, MD

## 2022-08-17 ENCOUNTER — Ambulatory Visit (INDEPENDENT_AMBULATORY_CARE_PROVIDER_SITE_OTHER): Payer: BC Managed Care – PPO | Admitting: Internal Medicine

## 2022-08-19 ENCOUNTER — Ambulatory Visit (INDEPENDENT_AMBULATORY_CARE_PROVIDER_SITE_OTHER): Payer: BC Managed Care – PPO | Admitting: Internal Medicine

## 2022-08-19 ENCOUNTER — Encounter (INDEPENDENT_AMBULATORY_CARE_PROVIDER_SITE_OTHER): Payer: Self-pay

## 2022-08-26 NOTE — Telephone Encounter (Signed)
Attempted phone call to pt.  Left voicemail message to contact RN at [phone] and will also send pt a message through her MyChart.

## 2022-09-03 ENCOUNTER — Encounter (INDEPENDENT_AMBULATORY_CARE_PROVIDER_SITE_OTHER): Payer: Self-pay | Admitting: Internal Medicine

## 2022-09-07 ENCOUNTER — Ambulatory Visit (INDEPENDENT_AMBULATORY_CARE_PROVIDER_SITE_OTHER): Payer: BC Managed Care – PPO | Admitting: Internal Medicine

## 2022-09-07 NOTE — Telephone Encounter (Signed)
Patient's appt has been cancelled

## 2022-09-16 NOTE — Telephone Encounter (Signed)
Pt has reveiwed MyChart message on message sent through [place] on 08/26/2022.

## 2023-02-07 ENCOUNTER — Encounter: Payer: Self-pay | Admitting: Gastroenterology

## 2023-03-26 ENCOUNTER — Other Ambulatory Visit (HOSPITAL_COMMUNITY): Payer: Self-pay

## 2023-06-30 LAB — HM MAMMOGRAPHY: HM Mammogram: NORMAL (ref 0–4)

## 2023-07-01 ENCOUNTER — Telehealth: Payer: Self-pay | Admitting: Family Medicine

## 2023-07-01 NOTE — Telephone Encounter (Signed)
Pt was called and scheduled !

## 2023-07-01 NOTE — Telephone Encounter (Signed)
Pt called to ask if Dr. Carmelia Roller would be willing to take her on as a NP. Pt has a friend Queen Slough - Current Pt of Dr. Carmelia Roller) that has recommended him to her. Please Advise.

## 2023-07-01 NOTE — Telephone Encounter (Signed)
OK 

## 2023-07-12 ENCOUNTER — Ambulatory Visit (AMBULATORY_SURGERY_CENTER): Payer: BC Managed Care – PPO

## 2023-07-12 VITALS — Ht 62.0 in | Wt 164.0 lb

## 2023-07-12 DIAGNOSIS — Z860100 Personal history of colon polyps, unspecified: Secondary | ICD-10-CM

## 2023-07-12 MED ORDER — NA SULFATE-K SULFATE-MG SULF 17.5-3.13-1.6 GM/177ML PO SOLN: 1.0000 | Freq: Once | ORAL | 0 refills | Status: AC

## 2023-07-12 NOTE — Progress Notes (Signed)
No egg or soy allergy known to patient  No issues known to pt with past sedation with any surgeries or procedures Patient denies ever being told they had issues or difficulty with intubation  No FH of Malignant Hyperthermia Pt is not on diet pills Pt is not on  home 02. Uses CPAP Pt is not on blood thinners  Pt denies issues with constipation  No A fib or A flutter. HX of prolonged QT interval. Have any cardiac testing pending--no  LOA: independent  Prep: suprep  Patient's chart reviewed by Cathlyn Parsons CNRA prior to previsit and patient appropriate for the [facility].  Previsit completed and red dot placed by patient's name on their procedure day (on provider's schedule).     PV competed with patient. Prep instructions sent via mychart and home address. Goodrx coupon for [facility] provided to use for price reduction if needed.

## 2023-07-18 ENCOUNTER — Encounter: Payer: Self-pay | Admitting: Gastroenterology

## 2023-08-03 ENCOUNTER — Ambulatory Visit: Payer: BC Managed Care – PPO | Admitting: Gastroenterology

## 2023-08-03 ENCOUNTER — Encounter: Payer: Self-pay | Admitting: Gastroenterology

## 2023-08-03 VITALS — BP 102/65 | HR 63 | Temp 97.7°F | Resp 11 | Ht 62.0 in | Wt 164.0 lb

## 2023-08-03 DIAGNOSIS — K648 Other hemorrhoids: Secondary | ICD-10-CM

## 2023-08-03 DIAGNOSIS — K6389 Other specified diseases of intestine: Secondary | ICD-10-CM

## 2023-08-03 DIAGNOSIS — Z860100 Personal history of colon polyps, unspecified: Secondary | ICD-10-CM

## 2023-08-03 DIAGNOSIS — K635 Polyp of colon: Secondary | ICD-10-CM | POA: Diagnosis not present

## 2023-08-03 DIAGNOSIS — D120 Benign neoplasm of cecum: Secondary | ICD-10-CM

## 2023-08-03 DIAGNOSIS — Z1211 Encounter for screening for malignant neoplasm of colon: Secondary | ICD-10-CM | POA: Diagnosis present

## 2023-08-03 DIAGNOSIS — D123 Benign neoplasm of transverse colon: Secondary | ICD-10-CM

## 2023-08-03 MED ORDER — SODIUM CHLORIDE 0.9 % IV SOLN: 500.0000 mL | INTRAVENOUS | Status: DC

## 2023-08-03 NOTE — Patient Instructions (Signed)
Educational handout provided to patient related to Hemorrhoids, Polyps  Resume previous diet  Continue present medications  Awaiting pathology results   YOU HAD AN ENDOSCOPIC PROCEDURE TODAY AT THE [facility]:   Refer to the procedure report that was given to you for any specific questions about what was found during the examination.  If the procedure report does not answer your questions, please call your gastroenterologist to clarify.  If you requested that your care partner not be given the details of your procedure findings, then the procedure report has been included in a sealed envelope for you to review at your convenience later.  YOU SHOULD EXPECT: Some feelings of bloating in the abdomen. Passage of more gas than usual.  Walking can help get rid of the air that was put into your GI tract during the procedure and reduce the bloating. If you had a lower endoscopy (such as a colonoscopy or flexible sigmoidoscopy) you may notice spotting of blood in your stool or on the toilet paper. If you underwent a bowel prep for your procedure, you may not have a normal bowel movement for a few days.  Please Note:  You might notice some irritation and congestion in your nose or some drainage.  This is from the oxygen used during your procedure.  There is no need for concern and it should clear up in a day or so.  SYMPTOMS TO REPORT IMMEDIATELY:  Following lower endoscopy (colonoscopy or flexible sigmoidoscopy):  Excessive amounts of blood in the stool  Significant tenderness or worsening of abdominal pains  Swelling of the abdomen that is new, acute  Fever of 100F or higher  For urgent or emergent issues, a gastroenterologist can be reached at any hour by calling [phone]. Do not use MyChart messaging for urgent concerns.    DIET:  We do recommend a small meal at first, but then you may proceed to your regular diet.  Drink plenty of fluids but you should avoid alcoholic  beverages for 24 hours.  ACTIVITY:  You should plan to take it easy for the rest of today and you should NOT DRIVE or use heavy machinery until tomorrow (because of the sedation medicines used during the test).    FOLLOW UP: Our staff will call the number listed on your records the next business day following your procedure.  We will call around 7:15- 8:00 am to check on you and address any questions or concerns that you may have regarding the information given to you following your procedure. If we do not reach you, we will leave a message.     If any biopsies were taken you will be contacted by phone or by letter within the next 1-3 weeks.  Please call us at [phone] if you have not heard about the biopsies in 3 weeks.    SIGNATURES/CONFIDENTIALITY: You and/or your care partner have signed paperwork which will be entered into your electronic medical record.  These signatures attest to the fact that that the information above on your After Visit Summary has been reviewed and is understood.  Full responsibility of the confidentiality of this discharge information lies with you and/or your care-partner.

## 2023-08-03 NOTE — Progress Notes (Signed)
Vs nad trans to pacu

## 2023-08-03 NOTE — Op Note (Signed)
[facility] Patient Name: Karina Wilkinson Procedure Date: 08/03/2023 8:14 AM MRN: [id number] Endoscopist: Viviann Spare P. Adela Lank , MD, [id number] Age: 62 Referring MD:  Date of Birth: 06/07/1961 Gender: Female Account #: [id number] Procedure:                Colonoscopy Indications:              High risk colon cancer surveillance: Personal                            history of colonic polyps - sessile serrated polyps                            removed 02/2018. Chronic intermittent loose stools,                            prior biopsies negative for microscopic colitis. Medicines:                Monitored Anesthesia Care Procedure:                Pre-Anesthesia Assessment:                           - Prior to the procedure, a History and Physical                            was performed, and patient medications and                            allergies were reviewed. The patient's tolerance of                            previous anesthesia was also reviewed. The risks                            and benefits of the procedure and the sedation                            options and risks were discussed with the patient.                            All questions were answered, and informed consent                            was obtained. Prior Anticoagulants: The patient has                            taken no anticoagulant or antiplatelet agents. ASA                            Grade Assessment: III - A patient with severe                            systemic disease. After reviewing the risks and  benefits, the patient was deemed in satisfactory                            condition to undergo the procedure.                           After obtaining informed consent, the colonoscope                            was passed under direct vision. Throughout the                            procedure, the patient's blood pressure, pulse, and                             oxygen saturations were monitored continuously. The                            Olympus Scope [id number] was introduced through the                            anus and advanced to the the cecum, identified by                            appendiceal orifice and ileocecal valve. The                            colonoscopy was performed without difficulty. The                            patient tolerated the procedure well. The quality                            of the bowel preparation was good. The ileocecal                            valve, appendiceal orifice, and rectum were                            photographed. Scope In: 8:34:08 AM Scope Out: 8:53:23 AM Scope Withdrawal Time: 0 hours 14 minutes 0 seconds  Total Procedure Duration: 0 hours 19 minutes 15 seconds  Findings:                 The perianal and digital rectal examinations were                            normal.                           A diminutive polyp was found in the cecum. The                            polyp was flat. The polyp was removed with a cold  snare. Resection and retrieval were complete.                           A 3 mm polyp was found in the transverse colon. The                            polyp was sessile. The polyp was removed with a                            cold snare. Resection and retrieval were complete.                           Internal hemorrhoids were found during                            retroflexion. The hemorrhoids were small.                           The exam was otherwise without abnormality. Complications:            No immediate complications. Estimated blood loss:                            Minimal. Estimated Blood Loss:     Estimated blood loss was minimal. Impression:               - One diminutive polyp in the cecum, removed with a                            cold snare. Resected and retrieved.                           - One 3 mm polyp in the transverse  colon, removed                            with a cold snare. Resected and retrieved.                           - Internal hemorrhoids.                           - The examination was otherwise normal. Recommendation:           - Patient has a contact number available for                            emergencies. The signs and symptoms of potential                            delayed complications were discussed with the                            patient. Return to normal activities tomorrow.  Written discharge instructions were provided to the                            patient.                           - Resume previous diet.                           - Continue present medications.                           - Await pathology results.                           - Follow up in the office for management of chronic                            loose stools. Viviann Spare P. Jamaira Sherk, MD 08/03/2023 8:58:29 AM This report has been signed electronically.

## 2023-08-03 NOTE — Progress Notes (Signed)
[facility] History and Physical   Primary Care Physician:  Irena Reichmann, DO   Reason for Procedure:   History of colon polyps  Plan:    colonoscopy     HPI: Karina Wilkinson is a 62 y.o. female  here for colonoscopy surveillance - last exam 02/2018 - 2 polyps removed, sessile serrated.   She has had some chronic loose stools - prior exam negative biopsies for MC. She has mild intermittent loose stools, is not all the time. No family history of colon cancer known. Otherwise feels well without any cardiopulmonary symptoms.   I have discussed risks / benefits of anesthesia and endoscopic procedure with [name] and they wish to proceed with the exams as outlined today.    Past Medical History:  Diagnosis Date   Allergy    Anal fissure    Anxiety    Arthritis    Asthma    Atypical mycobacterial infection    Back pain    Chest pain    Cylindrical bronchiectasis (HCC)    Depression    Diarrhea    Fibromyalgia    Fibromyalgia    Gestational diabetes    Heart murmur    HLD (hyperlipidemia)    Joint pain    Osteoporosis    Overweight (BMI 25.0-29.9) 10/29/2021   Palpitations    Rheumatoid arthritis (HCC)    Sleep apnea    SOB (shortness of breath)    Vitamin D deficiency     Past Surgical History:  Procedure Laterality Date   COLONOSCOPY     FOOT SURGERY Right 2000   low back surgery     LUMBAR DISC SURGERY  2008   plantar fasciitis surgery      Prior to Admission medications   Medication Sig Start Date End Date Taking? Authorizing Provider  albuterol (VENTOLIN HFA) 108 (90 Base) MCG/ACT inhaler Inhale 2 puffs into the lungs every 6 (six) hours as needed for wheezing or shortness of breath. Patient not taking: Reported on 07/12/2023 11/25/21   Chilton Greathouse, MD  benzonatate (TESSALON) 100 MG capsule Take 1 capsule (100 mg total) by mouth 3 (three) times daily as needed for cough. Patient not taking: Reported on 07/12/2023 11/25/21   Chilton Greathouse,  MD  buPROPion (WELLBUTRIN XL) 150 MG 24 hr tablet Take 150 mg by mouth every morning. 10/23/21   [provider]  busPIRone (BUSPAR) 10 MG tablet Take 10 mg by mouth as needed.    [provider]  citalopram (CELEXA) 20 MG tablet Take 60 mg by mouth daily.    [provider]  diclofenac (VOLTAREN) 75 MG EC tablet Take 75 mg by mouth 2 (two) times daily. Patient not taking: Reported on 07/12/2023    [provider]  fluorouracil (EFUDEX) 5 % cream Apply 1 Application topically 2 (two) times daily. 06/29/23   [provider]  fluticasone (FLONASE) 50 MCG/ACT nasal spray Place 1 spray into both nostrils daily.    [provider]  hydroxychloroquine (PLAQUENIL) 200 MG tablet Take 200 mg by mouth 2 (two) times daily. 10/18/21   [provider]  loperamide (IMODIUM) 2 MG capsule Take 2 mg by mouth as needed for diarrhea or loose stools.    [provider]  loratadine (CLARITIN) 10 MG tablet Take 10 mg by mouth daily as needed for allergies. Patient not taking: Reported on 07/12/2023    [provider]  rosuvastatin (CRESTOR) 10 MG tablet Take 10 mg by mouth daily.  [provider]  Vitamin D, Ergocalciferol, (DRISDOL) 50000 units CAPS capsule Take 50,000 Units by mouth every 7 (seven) days.    [provider]    Current Outpatient Medications  Medication Sig Dispense Refill   albuterol (VENTOLIN HFA) 108 (90 Base) MCG/ACT inhaler Inhale 2 puffs into the lungs every 6 (six) hours as needed for wheezing or shortness of breath. (Patient not taking: Reported on 07/12/2023) 8 g 5   benzonatate (TESSALON) 100 MG capsule Take 1 capsule (100 mg total) by mouth 3 (three) times daily as needed for cough. (Patient not taking: Reported on 07/12/2023) 90 capsule 1   buPROPion (WELLBUTRIN XL) 150 MG 24 hr tablet Take 150 mg by mouth every morning.     busPIRone (BUSPAR) 10 MG tablet Take 10 mg by mouth as needed.      citalopram (CELEXA) 20 MG tablet Take 60 mg by mouth daily.     diclofenac (VOLTAREN) 75 MG EC tablet Take 75 mg by mouth 2 (two) times daily. (Patient not taking: Reported on 07/12/2023)     fluorouracil (EFUDEX) 5 % cream Apply 1 Application topically 2 (two) times daily.     fluticasone (FLONASE) 50 MCG/ACT nasal spray Place 1 spray into both nostrils daily.     hydroxychloroquine (PLAQUENIL) 200 MG tablet Take 200 mg by mouth 2 (two) times daily.     loperamide (IMODIUM) 2 MG capsule Take 2 mg by mouth as needed for diarrhea or loose stools.     loratadine (CLARITIN) 10 MG tablet Take 10 mg by mouth daily as needed for allergies. (Patient not taking: Reported on 07/12/2023)     rosuvastatin (CRESTOR) 10 MG tablet Take 10 mg by mouth daily.     Vitamin D, Ergocalciferol, (DRISDOL) 50000 units CAPS capsule Take 50,000 Units by mouth every 7 (seven) days.     Current Facility-Administered Medications  Medication Dose Route Frequency Provider Last Rate Last Admin   0.9 %  sodium chloride infusion  500 mL Intravenous Continuous Loann Chahal, Willaim Rayas, MD        Allergies as of 08/03/2023 - Review Complete 08/03/2023  Allergen Reaction Noted   Other Other (See Comments) 07/12/2023    Family History  Problem Relation Age of Onset   Hypertension Mother    Peptic Ulcer Mother    Thyroid disease Mother    Depression Mother    Anxiety disorder Mother    Hypertension Father    Cancer Father    Heart disease Father    Hyperlipidemia Father    Stroke Father    Irritable bowel syndrome Sister    Ovarian cancer Paternal Aunt    Mental illness Maternal Grandmother    Stroke Maternal Grandmother    Hypertension Maternal Grandmother    Diabetes Paternal Grandmother    Ovarian cancer Paternal Grandmother    Ulcerative colitis Daughter    Colon cancer Neg Hx    Colon polyps Neg Hx    Rectal cancer Neg Hx    Stomach cancer Neg Hx     Social History   Socioeconomic History   Marital status:  Divorced    Spouse name: Not on file   Number of children: 2   Years of education: Not on file   Highest education level: Not on file  Occupational History   Occupation: retired [employer] Teacher  Tobacco Use   Smoking status: Former    Current packs/day: 0.00    Types: Cigarettes    Quit date: 1985  Years since quitting: 39.9   Smokeless tobacco: Never  Vaping Use   Vaping status: Never Used  Substance and Sexual Activity   Alcohol use: Yes    Alcohol/week: 3.0 standard drinks of alcohol    Types: 3 Glasses of wine per week    Comment: 0-2 per day   Drug use: No   Sexual activity: Not on file  Other Topics Concern   Not on file  Social History Narrative   Not on file   Social Determinants of Health   Financial Resource Strain: Not on file  Food Insecurity: Not on file  Transportation Needs: Not on file  Physical Activity: Not on file  Stress: Not on file  Social Connections: Not on file  Intimate Partner Violence: Not on file    Review of Systems: All other review of systems negative except as mentioned in the HPI.  Physical Exam: Vital signs BP 117/78   Pulse 76   Temp 97.7 F (36.5 C)   Ht 5\' 2"  (1.575 m)   SpO2 96%   BMI 30.00 kg/m   General:   Alert,  Well-developed, pleasant and cooperative in NAD Lungs:  Clear throughout to auscultation.   Heart:  Regular rate and rhythm Abdomen:  Soft, nontender and nondistended.   Neuro/Psych:  Alert and cooperative. Normal mood and affect. A and O x 3  Harlin Rain, MD [facility]

## 2023-08-03 NOTE — Progress Notes (Signed)
Called to room to assist during endoscopic procedure.  Patient ID and intended procedure confirmed with present staff. Received instructions for my participation in the procedure from the performing physician.  

## 2023-08-03 NOTE — Progress Notes (Signed)
Pt's states no medical or surgical changes since previsit or office visit. 

## 2023-08-08 ENCOUNTER — Telehealth: Payer: Self-pay

## 2023-08-08 NOTE — Telephone Encounter (Signed)
Left message on answering machine. 

## 2023-08-09 LAB — SURGICAL PATHOLOGY

## 2023-08-15 IMAGING — CT CT ANGIO CHEST
2 of 7 series · 17 of 46 positions shown · IV contrast (agent unspecified)
Comparison: None.

CLINICAL DATA: Congestion for 1 week.  Difficulty breathing.

EXAM:
CT ANGIOGRAPHY CHEST WITH CONTRAST
TECHNIQUE: Multidetector CT imaging of the chest was performed using the
standard protocol during bolus administration of intravenous
contrast. Multiplanar CT image reconstructions and MIPs were
obtained to evaluate the vascular anatomy.

[Series 5: pe axial thins · axial · 0.73mm/px · z∈[+1163,+1431]mm · 14 of 310 slices shown]
[im 21/310  lung]
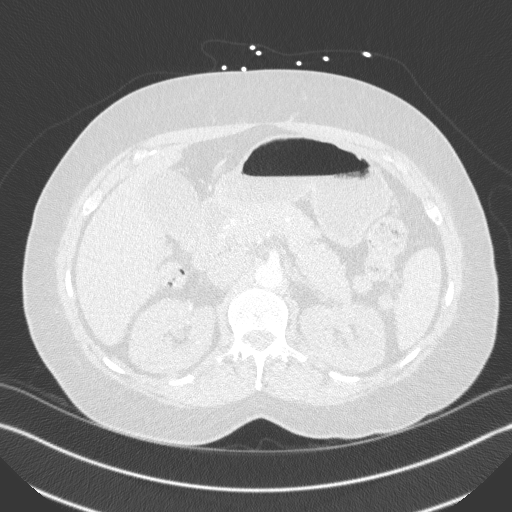
[im 42/310  soft-tissue]
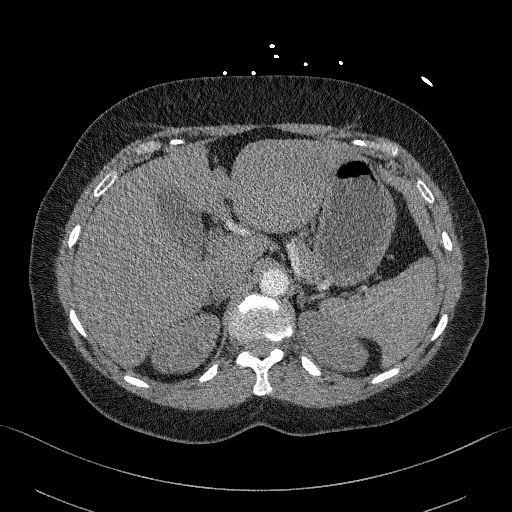
[im 62/310  lung]
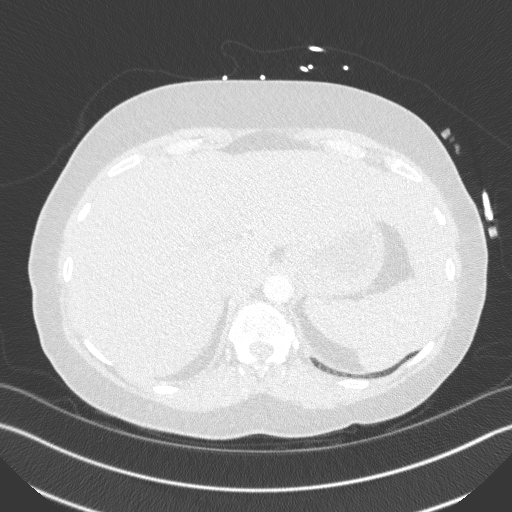
[im 83/310  soft-tissue]
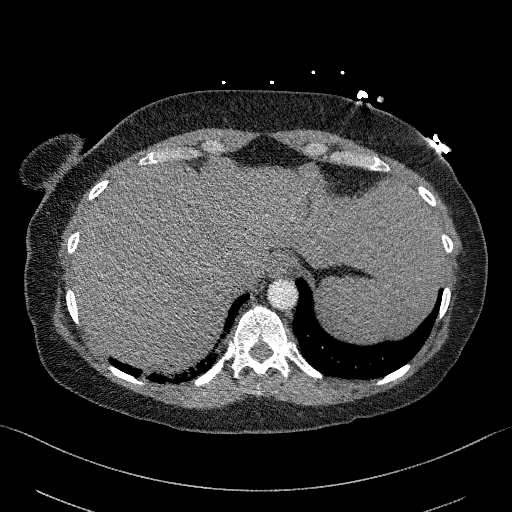
[im 104/310  lung]
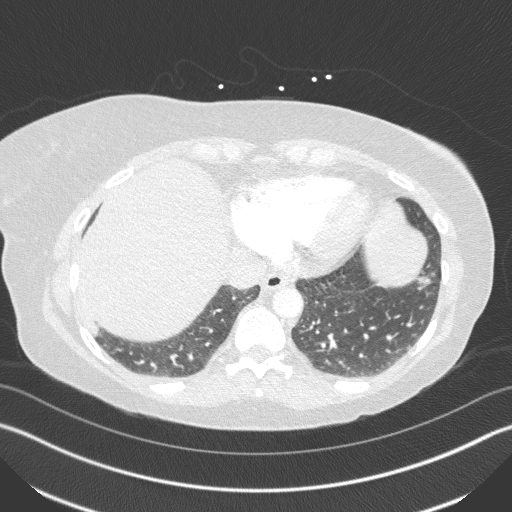
[im 124/310  soft-tissue]
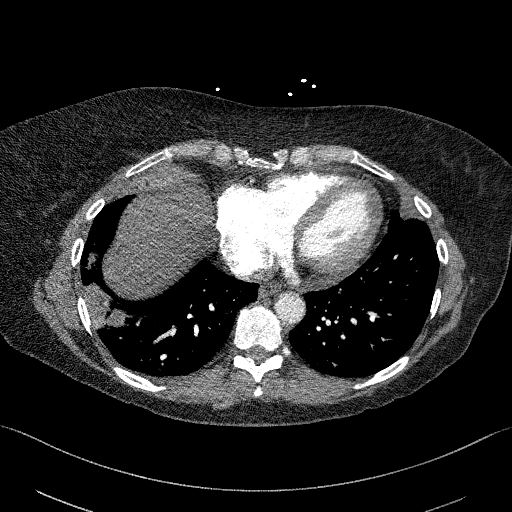
[im 145/310  lung]
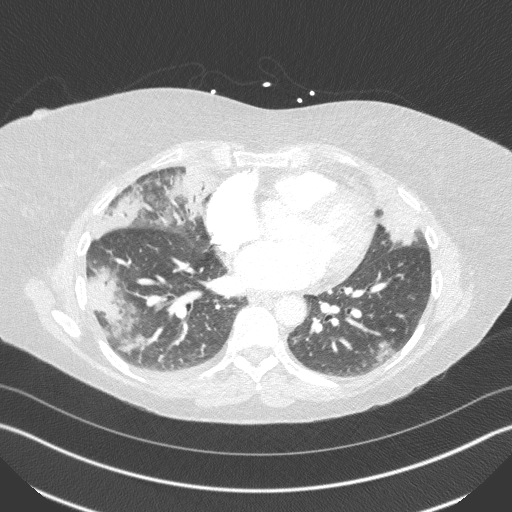
[im 165/310  soft-tissue]
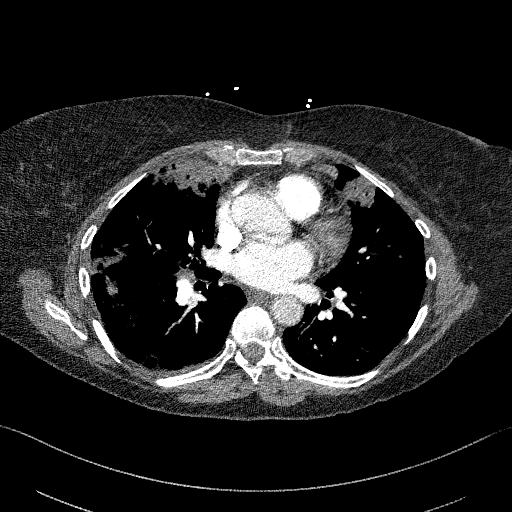
[im 186/310  lung]
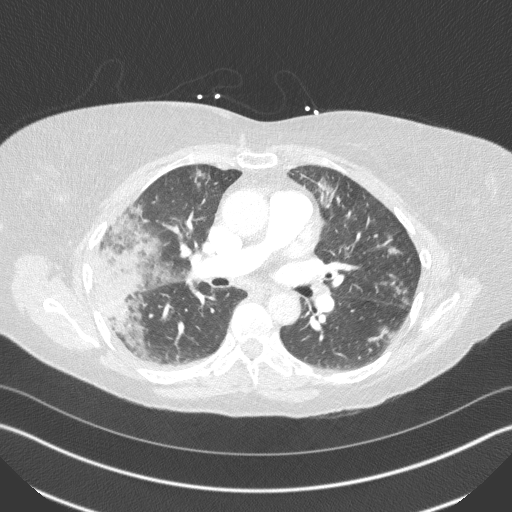
[im 207/310  soft-tissue]
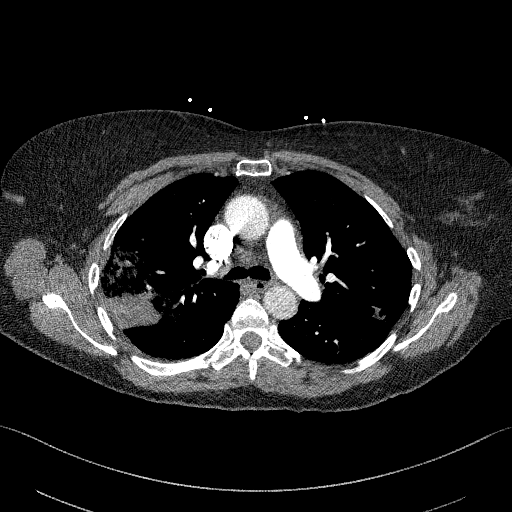
[im 227/310  lung]
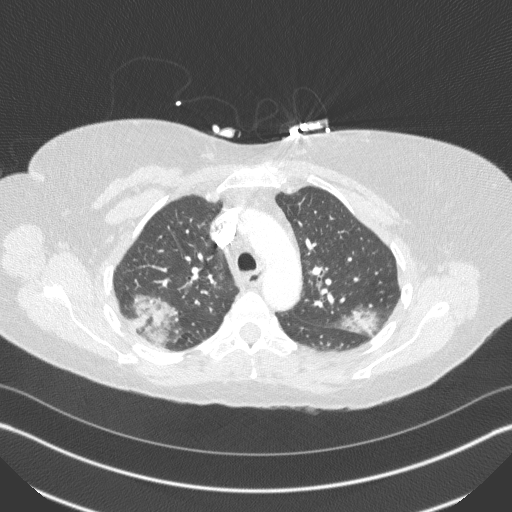
[im 248/310  soft-tissue]
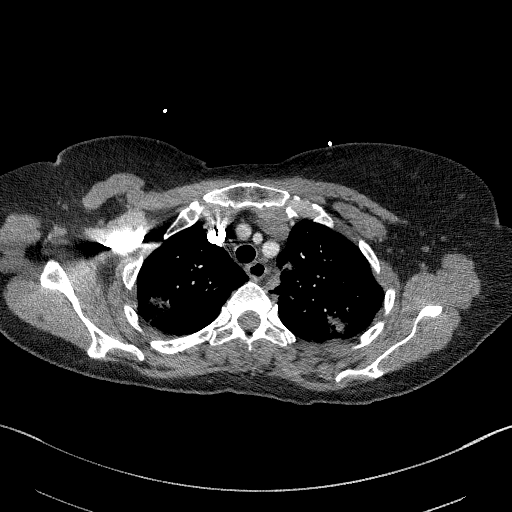
[im 268/310  lung]
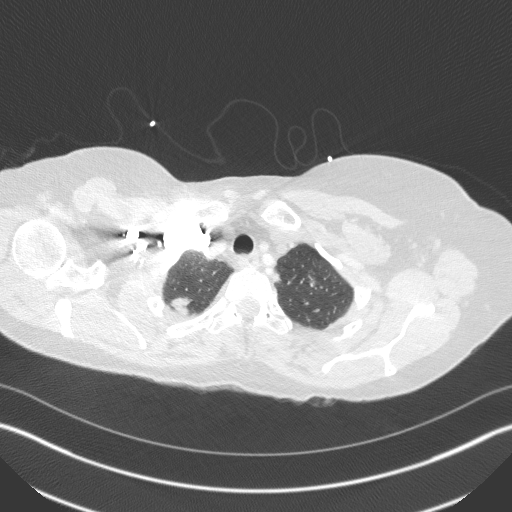
[im 289/310  soft-tissue]
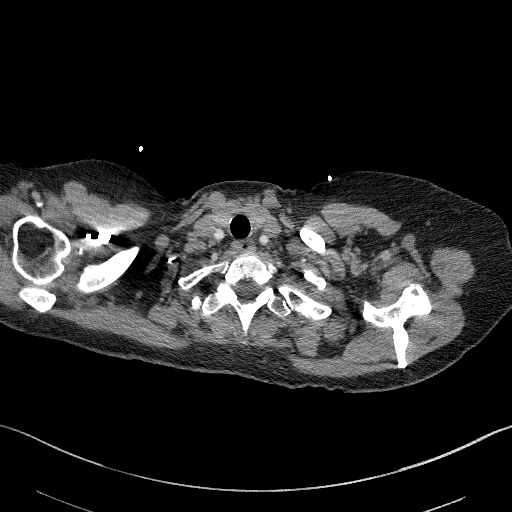

[Series 7: cor soft · coronal · 0.64mm/px · 3 of 119 slices shown]
[im 30/119  soft-tissue]
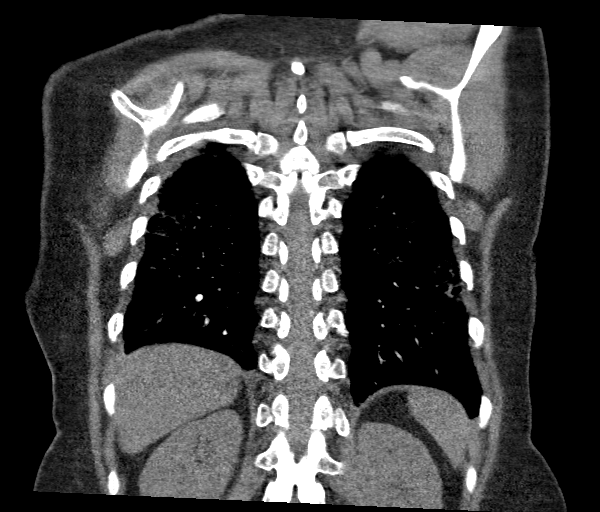
[im 60/119  soft-tissue]
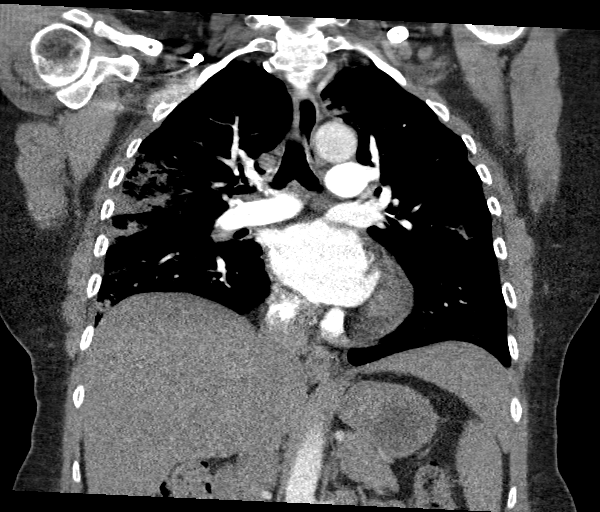
[im 89/119  soft-tissue]
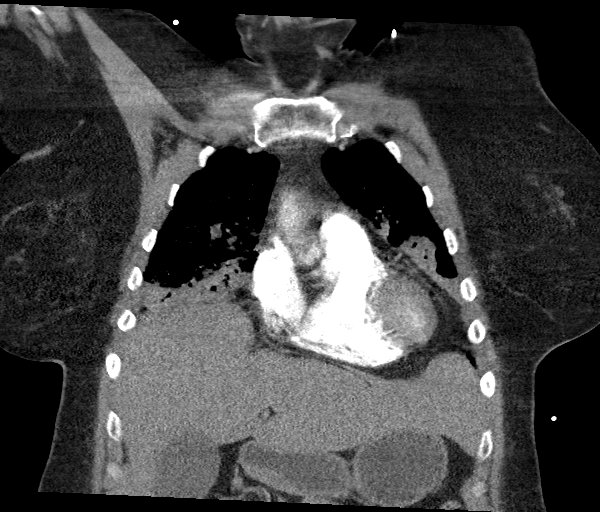

[17 of 46 positions shown; findings below may reference images not displayed]

RADIATION DOSE REDUCTION: This exam was performed according to the
departmental dose-optimization program which includes automated
exposure control, adjustment of the mA and/or kV according to
patient size and/or use of iterative reconstruction technique.

CONTRAST:  75mL OMNIPAQUE IOHEXOL 350 MG/ML SOLN
FINDINGS: Cardiovascular: Satisfactory opacification of the pulmonary arteries
to the segmental level. No evidence of pulmonary embolism. Normal
heart size. No pericardial effusion.

Mediastinum/Nodes: No enlarged mediastinal, hilar, or axillary lymph
nodes. Thyroid gland, trachea, and esophagus demonstrate no
significant findings.

Lungs/Pleura: Patchy areas of airspace disease in bilateral upper
lobes, right middle lobe and bilateral lower lobes most concerning
for multilobar pneumonia, right worse than left, with a peripheral
predominance including atypical viral pneumonia.

Upper Abdomen: No acute abnormality.

Musculoskeletal: No chest wall abnormality. No acute or significant
osseous findings.

Review of the MIP images confirms the above findings.
IMPRESSION: 1. No pulmonary embolus.
2. Bilateral multilobar pneumonia, right worse than left.

## 2023-08-17 IMAGING — DX DG CHEST 1V PORT
1 series · 1 of 1 positions shown · non-contrast
Comparison: 10/29/2021

CLINICAL DATA: Hypoxia, chest pain

EXAM:
PORTABLE CHEST 1 VIEW

[chest ap]
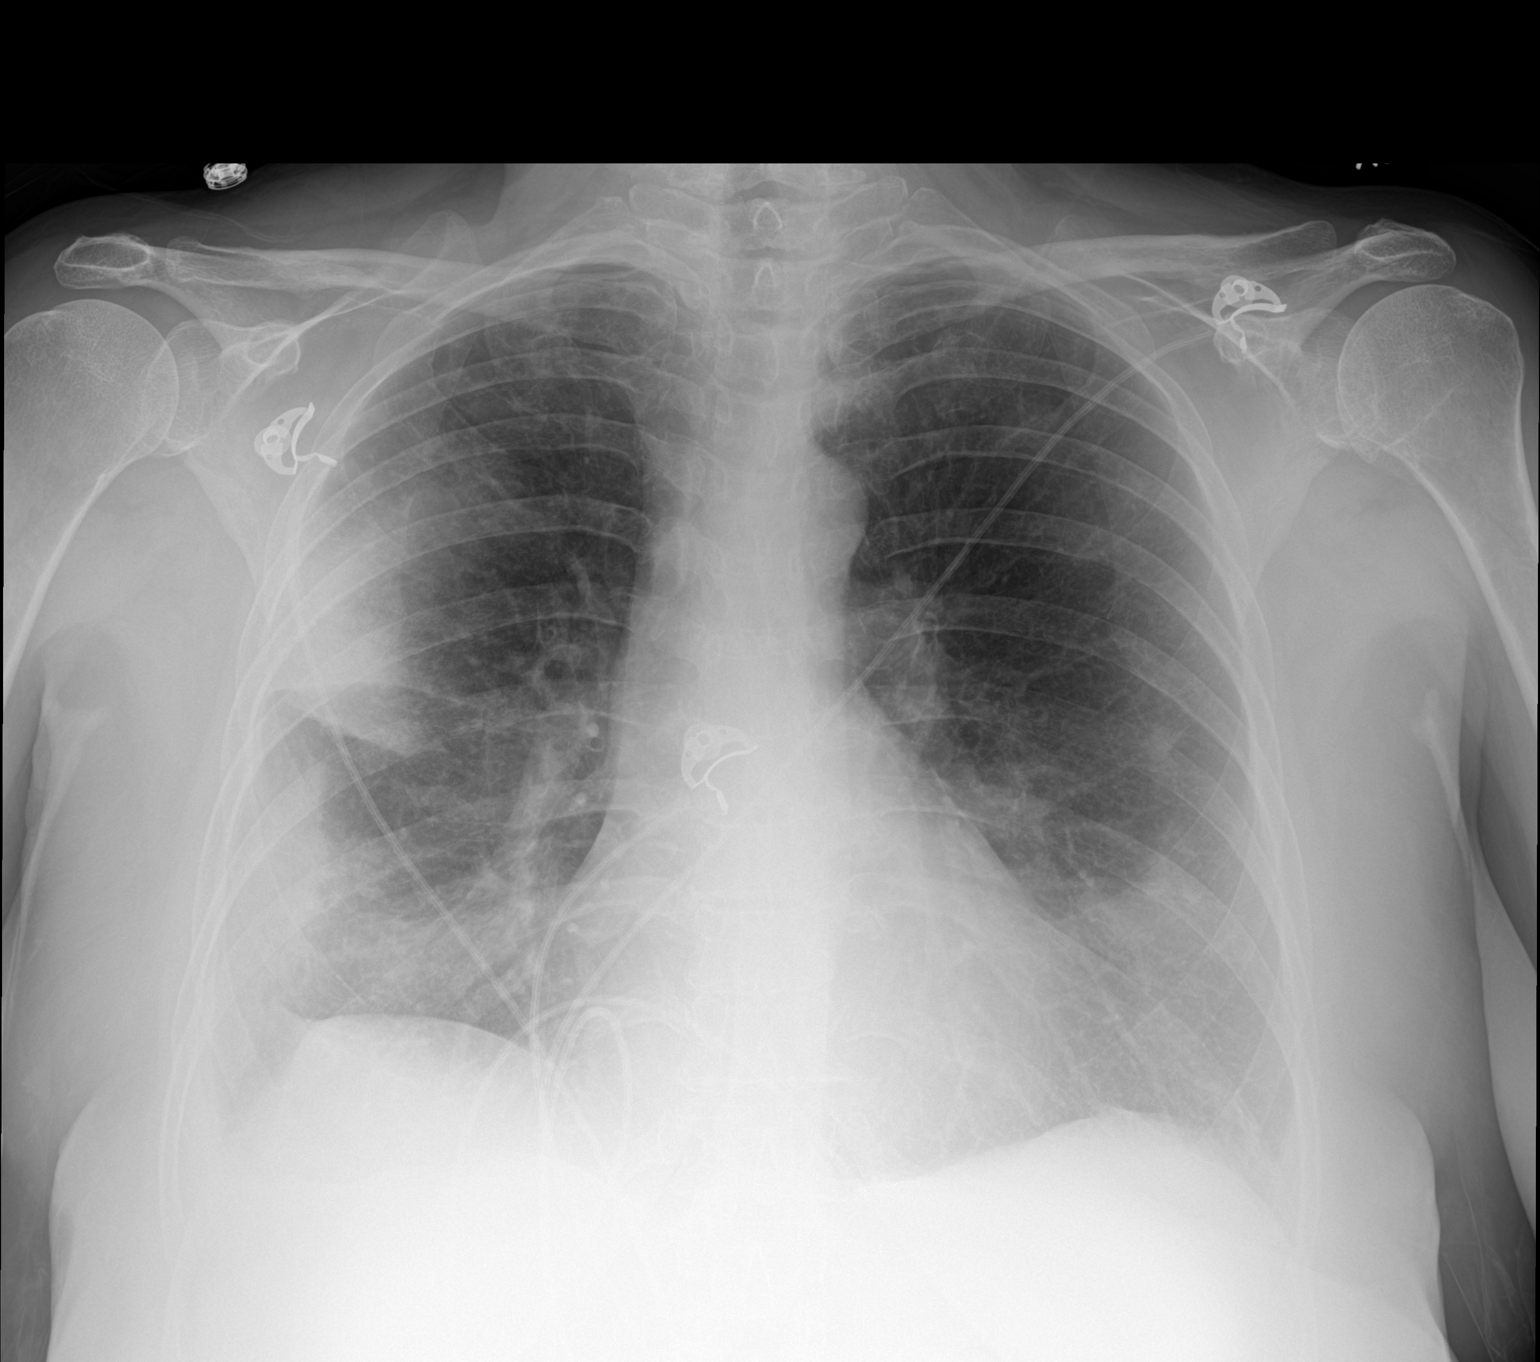

[1 of 1 positions shown; findings below may reference images not displayed]

FINDINGS: Cardiac size is within normal limits. There are no signs of
pulmonary edema. There is patchy alveolar infiltrate in the lateral
aspect of right upper lung fields and right lower lung fields. There
is possible increase in infiltrate in the right upper lung fields on
the lateral aspect of right lower lung fields. Small patchy
infiltrates are seen in the left parahilar region. There is blunting
of right lateral CP angle. There is mild blunting of left lateral CP
angle. There is no pneumothorax.
IMPRESSION: There are multiple foci of infiltrates in both lungs, more so on the
right side suggesting multifocal pneumonia. Small pleural effusions,
more so on the right side.

## 2023-09-21 ENCOUNTER — Ambulatory Visit: Payer: 59 | Admitting: Family Medicine

## 2023-09-21 ENCOUNTER — Encounter: Payer: Self-pay | Admitting: Family Medicine

## 2023-09-21 VITALS — BP 105/68 | HR 104 | Temp 97.7°F | Resp 16 | Ht 62.0 in | Wt 167.8 lb

## 2023-09-21 DIAGNOSIS — E7800 Pure hypercholesterolemia, unspecified: Secondary | ICD-10-CM | POA: Insufficient documentation

## 2023-09-21 DIAGNOSIS — F411 Generalized anxiety disorder: Secondary | ICD-10-CM | POA: Diagnosis not present

## 2023-09-21 DIAGNOSIS — F325 Major depressive disorder, single episode, in full remission: Secondary | ICD-10-CM

## 2023-09-21 MED ORDER — CITALOPRAM HYDROBROMIDE 20 MG PO TABS: 60.0000 mg | ORAL_TABLET | Freq: Every day | ORAL | 3 refills | Status: DC

## 2023-09-21 MED ORDER — BUPROPION HCL ER (XL) 150 MG PO TB24: 150.0000 mg | ORAL_TABLET | Freq: Every morning | ORAL | 3 refills | Status: DC

## 2023-09-21 MED ORDER — BUSPIRONE HCL 10 MG PO TABS: 10.0000 mg | ORAL_TABLET | ORAL | 3 refills | Status: DC | PRN

## 2023-09-21 MED ORDER — ROSUVASTATIN CALCIUM 10 MG PO TABS: 10.0000 mg | ORAL_TABLET | Freq: Every day | ORAL | 3 refills | Status: DC

## 2023-09-21 NOTE — Progress Notes (Signed)
 Chief Complaint  Patient presents with   Establish Care    Establishing Care       New Patient Visit SUBJECTIVE: HPI: Karina Wilkinson is an 63 y.o.female who is being seen for establishing care.  The patient was previously seen at Dr. Romaine Closs office of [facility].  Hyperlipidemia Patient presents for hyperlipidemia follow up. Currently being treated with Crestor  10 mg/d and compliance with treatment thus far has been good. She denies myalgias. She is usually adhering to a healthy diet. Exercise: sometimes yoga No CP or SOB.  The patient is not known to have coexisting coronary artery disease.  Anxiety/depression Has had s/s's since junior HS. Currently on Celexa  60 mg/d, Wellbutrin  XL 150 mg/d, Buspar  10 mg prn. Compliant, no AE's. No SI, HI, self medication. She is not following with a therapist right now.   Past Medical History:  Diagnosis Date   Allergy    Anal fissure    Anxiety    Arthritis    Asthma    Atypical mycobacterial infection    Cylindrical bronchiectasis (HCC)    Depression    Diarrhea    Fibromyalgia    Heart murmur    HLD (hyperlipidemia)    Joint pain    Osteoporosis    Overweight (BMI 25.0-29.9) 10/29/2021   Rheumatoid arthritis (HCC)    Sleep apnea    on CPAP   Vitamin D  deficiency    Past Surgical History:  Procedure Laterality Date   COLONOSCOPY     LUMBAR DISC SURGERY  2008   plantar fasciitis surgery Right 2000   Family History  Problem Relation Age of Onset   Hypertension Mother    Peptic Ulcer Mother    Thyroid  disease Mother    Depression Mother    Anxiety disorder Mother    Macular degeneration Mother    Hypertension Father    Cancer Father        Brain   Heart disease Father    Hyperlipidemia Father    Stroke Father    Irritable bowel syndrome Sister    Mental illness Maternal Grandmother    Stroke Maternal Grandmother    Hypertension Maternal Grandmother    Diabetes Paternal Grandmother    Ovarian  cancer Paternal Grandmother    Ulcerative colitis Daughter    Ovarian cancer Paternal Aunt    Colon cancer Neg Hx    Rectal cancer Neg Hx    Stomach cancer Neg Hx    No Known Allergies   Current Outpatient Medications:    benzonatate  (TESSALON ) 100 MG capsule, Take 1 capsule (100 mg total) by mouth 3 (three) times daily as needed for cough., Disp: 90 capsule, Rfl: 1   fluorouracil (EFUDEX) 5 % cream, Apply 1 Application topically 2 (two) times daily., Disp: , Rfl:    fluticasone  (FLONASE ) 50 MCG/ACT nasal spray, Place 1 spray into both nostrils daily., Disp: , Rfl:    hydroxychloroquine  (PLAQUENIL ) 200 MG tablet, Take 200 mg by mouth 2 (two) times daily., Disp: , Rfl:    loperamide  (IMODIUM ) 2 MG capsule, Take 2 mg by mouth as needed for diarrhea or loose stools., Disp: , Rfl:    loratadine (CLARITIN) 10 MG tablet, Take 10 mg by mouth daily as needed for allergies., Disp: , Rfl:    Vitamin D , Ergocalciferol , (DRISDOL) 50000 units CAPS capsule, Take 50,000 Units by mouth every 7 (seven) days., Disp: , Rfl:    buPROPion  (WELLBUTRIN  XL) 150 MG 24 hr tablet, Take 1  tablet (150 mg total) by mouth every morning., Disp: 90 tablet, Rfl: 3   busPIRone  (BUSPAR ) 10 MG tablet, Take 1 tablet (10 mg total) by mouth as needed., Disp: 30 tablet, Rfl: 3   citalopram  (CELEXA ) 20 MG tablet, Take 3 tablets (60 mg total) by mouth daily., Disp: 270 tablet, Rfl: 3   rosuvastatin  (CRESTOR ) 10 MG tablet, Take 1 tablet (10 mg total) by mouth daily., Disp: 90 tablet, Rfl: 3  OBJECTIVE: BP 105/68   Pulse (!) 104   Temp 97.7 F (36.5 C) (Oral)   Resp 16   Ht 5\' 2"  (1.575 m)   Wt 167 lb 12.8 oz (76.1 kg)   SpO2 97%   BMI 30.69 kg/m  General:  well developed, well nourished, in no apparent distress Skin:  no significant moles, warts, or growths Nose:  nares patent, septum midline, mucosa normal Throat/Pharynx:  lips and gingiva without lesion; tongue and uvula midline; non-inflamed pharynx; no exudates or  postnasal drainage Lungs:  clear to auscultation, breath sounds equal bilaterally, no respiratory distress Cardio:  regular rate and rhythm, no LE edema or bruits Musculoskeletal:  symmetrical muscle groups noted without atrophy or deformity Neuro:  gait normal Psych: well oriented with normal range of affect and appropriate judgment/insight  ASSESSMENT/PLAN: GAD (generalized anxiety disorder) - Plan: citalopram  (CELEXA ) 20 MG tablet, buPROPion  (WELLBUTRIN  XL) 150 MG 24 hr tablet, busPIRone  (BUSPAR ) 10 MG tablet  Pure hypercholesterolemia - Plan: rosuvastatin  (CRESTOR ) 10 MG tablet  Patient instructed to sign release of records form from their previous PCP. Hyperlipidemia-chronic, stable.  Continue Crestor  10 mg daily.  Counseled on diet and exercise. GAD/depression- Chronic, stable. Cont Celexa  60 mg/d, Wellbutrin  XL 150 mg/d, BuSpar  10 mg TID prn. Counseling and psychiatry contact info provided today.  Patient should return in 6 mo for CPE. The patient voiced understanding and agreement to the plan.   Shellie Dials Arthur, DO 09/21/23  9:44 AM

## 2023-09-21 NOTE — Patient Instructions (Addendum)
 Keep the diet clean and stay active.  Consider getting your final shingles vaccine at the pharmacy.   Aim to do some physical exertion for 150 minutes per week. This is typically divided into 5 days per week, 30 minutes per day. The activity should be enough to get your heart rate up. Anything is better than nothing if you have time constraints.  Please consider counseling. Contact [phone] to schedule an appointment or inquire about cost/insurance coverage.  [facility] located at [address], [place], [place].  Phone number = [phone].  Dr. Dewight Fore - Adult Psychiatry.    [facility] located at [address] [place], [place], [place]. Phone number = [phone].   [facility] located at [address], [place], [place].  Phone number = [phone].   [facility] located at [address] [place], [place], [place].  Phone number = [phone].  [facility] [address] [place], [place] [address] [phone]  [facility] [address] [place], [place] [address] [phone]  [facility] [address] [place], [place] [address] [phone]  [facility] [address], [place], [place], [phone] [address], [place], [place], [phone]  [facility] [address] [place], [place] [address] [phone]  [facility] [address] [place], [place] [phone]  [facility] [address] [place], [place] [phone]  [facility] [address] [place], [place]   [phone].   Please go online to complete a form to submit first.  [facility]  [address] [place], [place] [phone].     [facility] -  local  practices located at: [address], [place], [place]. [phone]. [address] [place], [place], [place].  [phone]. [address], [place], [place].  [phone].  Contact one of these offices sooner than later as it can take 2-3 months to get a new patient appointment.   Let us  know if you need anything.

## 2023-10-23 ENCOUNTER — Other Ambulatory Visit: Payer: Self-pay | Admitting: Family Medicine

## 2023-10-23 DIAGNOSIS — F411 Generalized anxiety disorder: Secondary | ICD-10-CM

## 2023-10-24 ENCOUNTER — Telehealth: Payer: Self-pay

## 2023-10-24 NOTE — Telephone Encounter (Signed)
Copied from CRM [id number]. Topic: Clinical - Medication Question >> Oct 24, 2023  2:41 PM Sonny Dandy B wrote: Reason for CRM: pt called to advise provider she is having muscle spasms, and needs medication for fibro myalgia pt states she usually takes cyclobenzatine

## 2023-10-24 NOTE — Telephone Encounter (Signed)
Is this a chronic med? I don't see it on her list. If not, sched her plz.

## 2023-10-25 ENCOUNTER — Ambulatory Visit: Payer: 59 | Admitting: Nurse Practitioner

## 2023-10-25 NOTE — Telephone Encounter (Signed)
Sent the pt mychart message, asking her about the medication.

## 2023-11-13 ENCOUNTER — Encounter: Payer: Self-pay | Admitting: Acute Care

## 2023-11-29 ENCOUNTER — Ambulatory Visit: Payer: 59 | Admitting: Nurse Practitioner

## 2023-12-09 ENCOUNTER — Ambulatory Visit: Admitting: Pulmonary Disease

## 2023-12-12 IMAGING — CT CT CHEST HIGH RESOLUTION
2 of 7 series · 14 of 36 positions shown, 17 images · non-contrast
Comparison: 10/29/2021 chest CT angiogram.

CLINICAL DATA: Dyspnea.  History of pneumonia.



[Series 6: coronal · coronal · 0.59mm/px · 3 of 150 slices shown]
[im 30/150  lung]
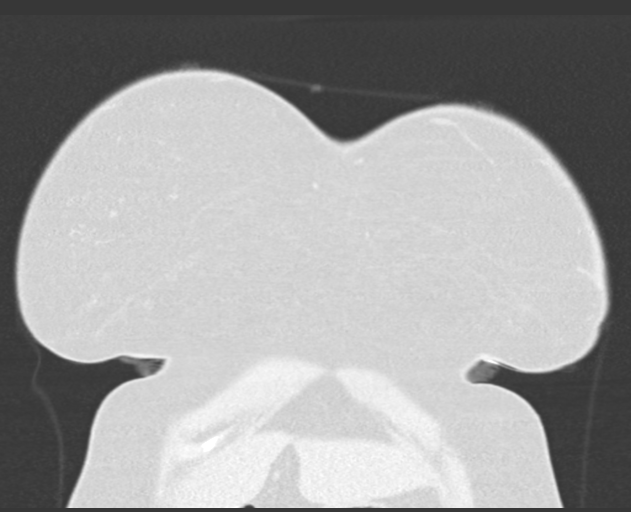
[im 60/150  lung]
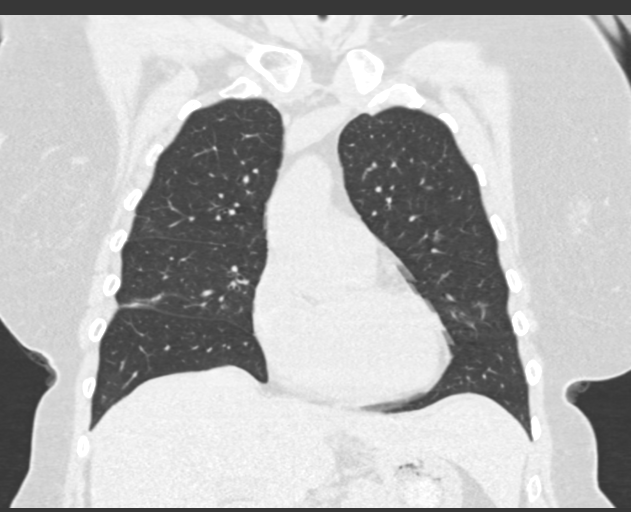
[im 90/150  lung]
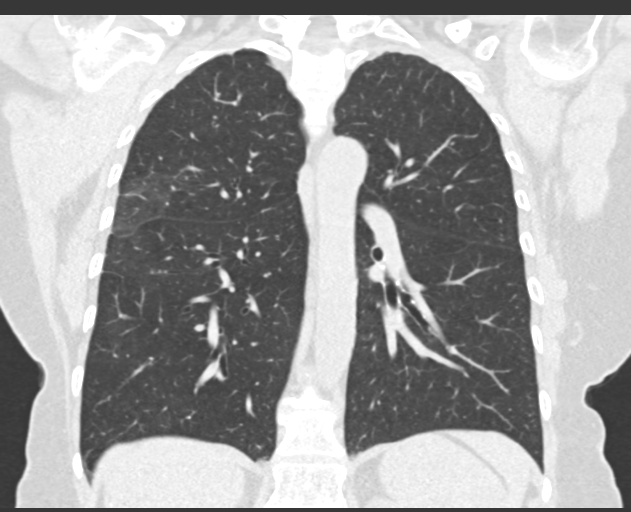

[Series 8: high resolution · axial · 0.68mm/px · z∈[+1293,+1533]mm · 11 of 289 slices shown, 14 images]
[im 25/289  mediastinal]
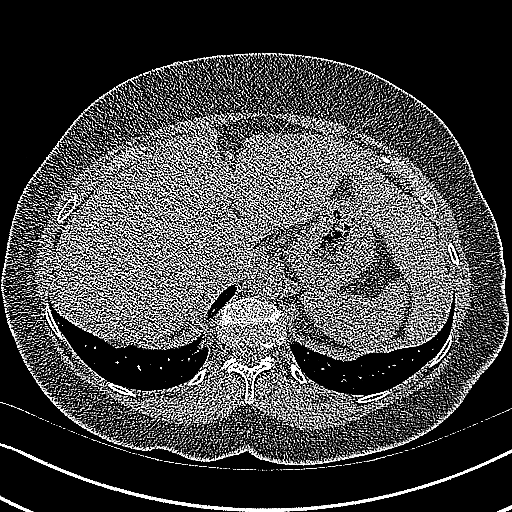
[im 25/289  lung]
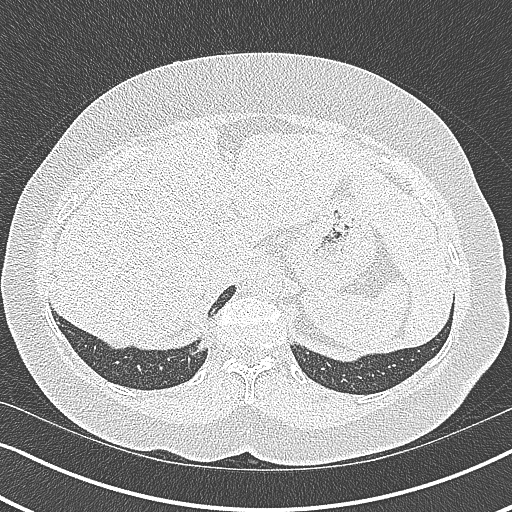
[im 49/289  lung]
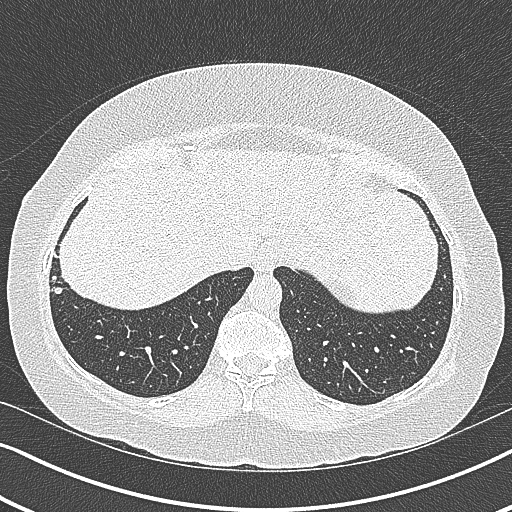
[im 73/289  lung]
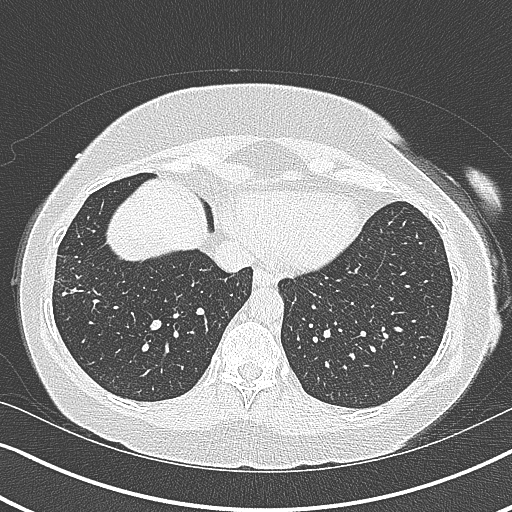
[im 97/289  lung]
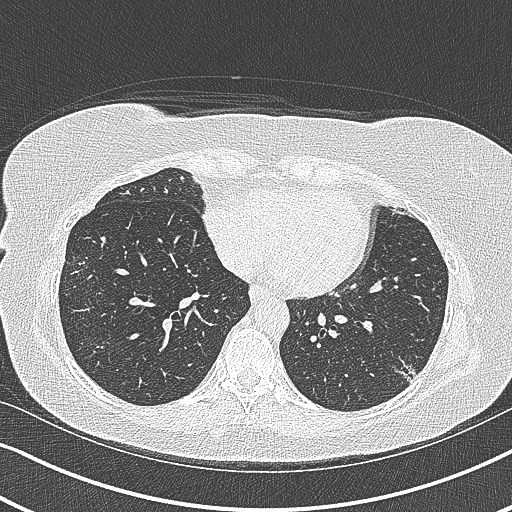
[im 121/289  mediastinal]
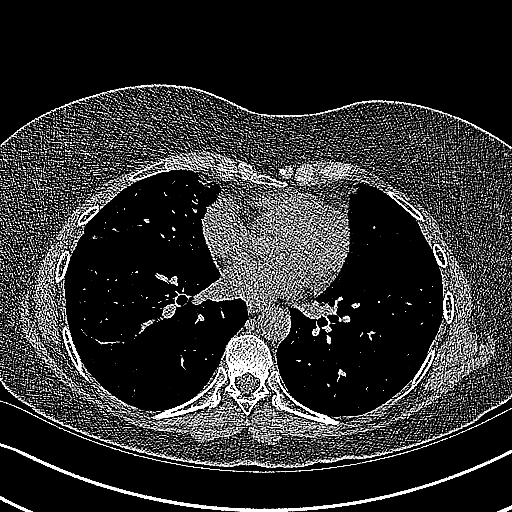
[im 121/289  lung]
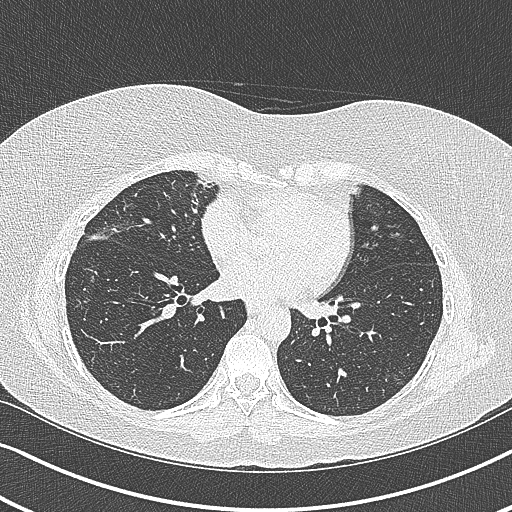
[im 145/289  lung]
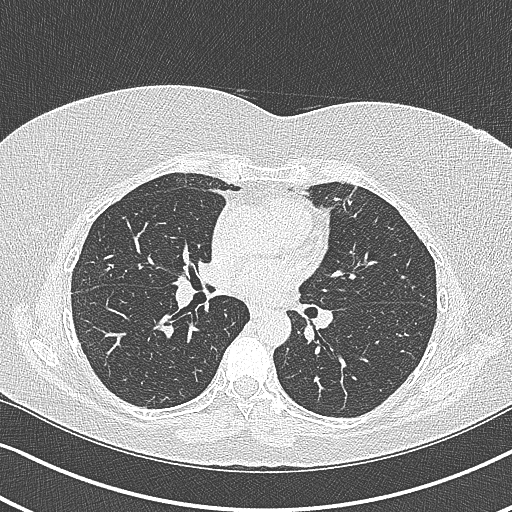
[im 169/289  lung]
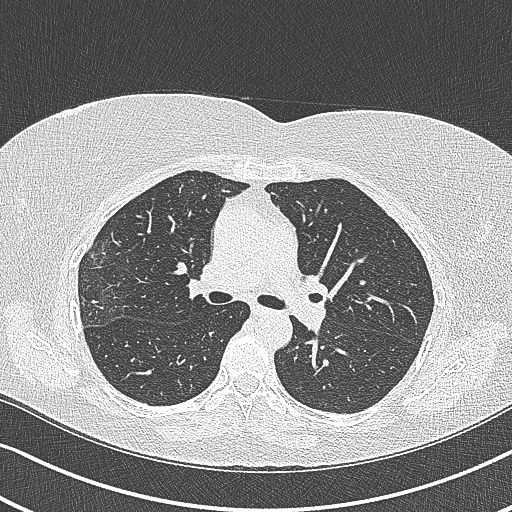
[im 193/289  lung]
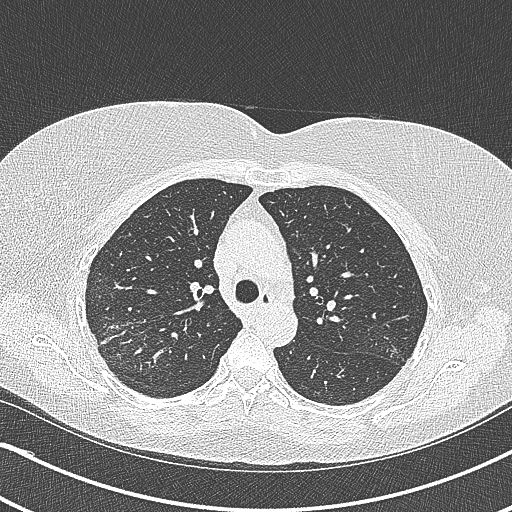
[im 217/289  mediastinal]
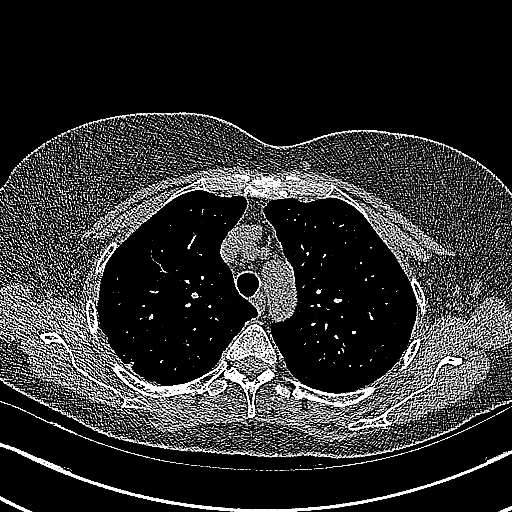
[im 217/289  lung]
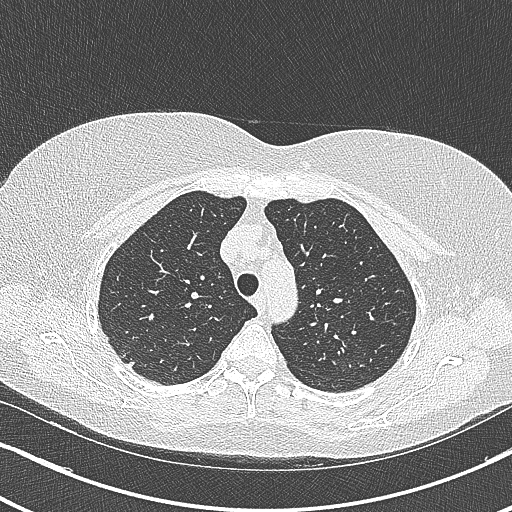
[im 241/289  lung]
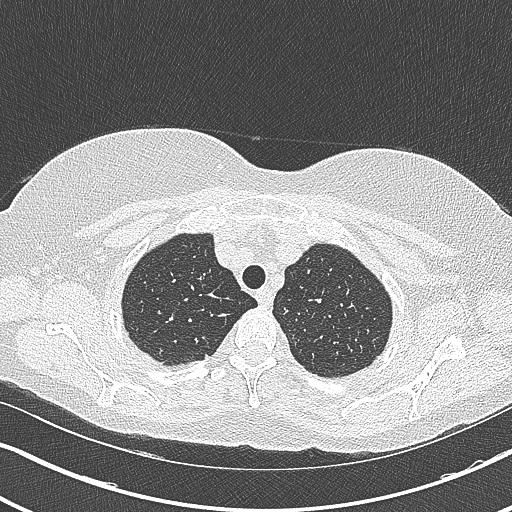
[im 265/289  lung]
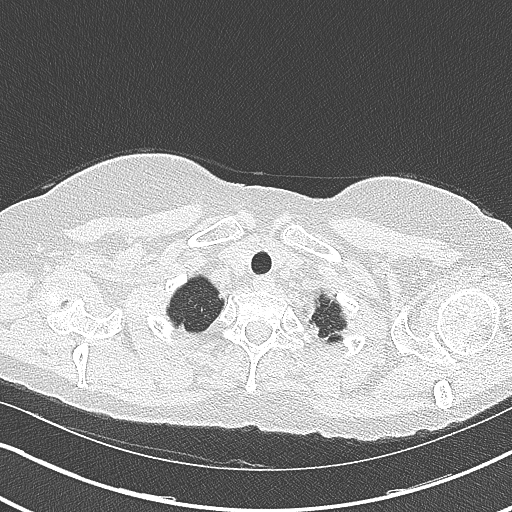

[14 of 36 positions shown; findings below may reference images not displayed]

FINDINGS: Cardiovascular: Normal heart size. No significant pericardial
effusion/thickening. Atherosclerotic nonaneurysmal thoracic aorta.
Normal caliber pulmonary arteries.

Mediastinum/Nodes: No discrete thyroid nodules. Unremarkable
esophagus. No pathologically enlarged axillary, mediastinal or hilar
lymph nodes, noting limited sensitivity for the detection of hilar
adenopathy on this noncontrast study.

Lungs/Pleura: No pneumothorax. No pleural effusion. No acute
consolidative airspace disease or lung masses. Widespread
mild-to-moderate cylindrical bronchiectasis in both lungs with
associated patchy mucoid impaction and mild tree-in-bud opacity,
most prominent in the right middle lobe and peripheral basilar right
lower lobe. Scattered thin parenchymal bands at the areas of
bronchiectasis. The regions of consolidation throughout both lungs
on the prior scan have resolved. No significant lobular air trapping
or evidence of tracheobronchomalacia on the expiration sequence. No
significant regions of subpleural reticulation, ground-glass opacity
or frank honeycombing. No significant pulmonary nodules.

Upper abdomen: No acute abnormality.

Musculoskeletal: No aggressive appearing focal osseous lesions. Mild
thoracic spondylosis.
IMPRESSION: 1. Widespread mild-to-moderate cylindrical bronchiectasis in both
lungs with associated patchy mucoid impaction and mild tree-in-bud
opacity, most prominent in the right middle lobe and peripheral
basilar right lower lobe. Atypical mycobacterial infection (BRACK TIGER) not
excluded.
2. No evidence of interstitial lung disease.
3. Aortic Atherosclerosis ([id number]).

## 2024-01-06 ENCOUNTER — Ambulatory Visit: Admitting: Nurse Practitioner

## 2024-02-02 ENCOUNTER — Ambulatory Visit: Admitting: Adult Health

## 2024-02-07 NOTE — Progress Notes (Unsigned)
 02/07/2024 Karina Wilkinson [id number] 1961/08/11   Chief Complaint: Loose stools   History of Present Illness: Karina Wilkinson is a 63 year old female with a past medical history of anxiety, depression, fibromyalgia, rheumatoid arthritis, hyperlipidemia, asthma, atypical mycobacterial infection, sleep apnea, chronic diarrhea and colon polyps. She is known by Dr. General Kenner.      Latest Ref Rng & Units 07/13/2022   10:26 AM 11/25/2021   11:51 AM 11/01/2021    5:25 AM  CBC  WBC 3.4 - 10.8 x10E3/uL 4.6  5.1  12.9   Hemoglobin 11.1 - 15.9 g/dL 40.9  81.1  91.4   Hematocrit 34.0 - 46.6 % 43.2  40.6  36.6   Platelets 150 - 450 x10E3/uL 270  233.0  519         Latest Ref Rng & Units 07/13/2022   10:26 AM 11/01/2021    5:25 AM 10/31/2021    4:50 AM  CMP  Glucose 70 - 99 mg/dL 86  95  89   BUN 8 - 27 mg/dL 18  10  10    Creatinine 0.57 - 1.00 mg/dL 7.82  9.56  2.13   Sodium 134 - 144 mmol/L 138  137  136   Potassium 3.5 - 5.2 mmol/L 4.6  4.4  4.0   Chloride 96 - 106 mmol/L 100  100  100   CO2 20 - 29 mmol/L 22  27  28    Calcium  8.7 - 10.3 mg/dL 9.4  8.6  8.3   Total Protein 6.0 - 8.5 g/dL 7.1  7.0  6.9   Total Bilirubin 0.0 - 1.2 mg/dL 0.5  0.3  0.3   Alkaline Phos 44 - 121 IU/L 83  104  101   AST 0 - 40 IU/L 17  77  97   ALT 0 - 32 IU/L 18  142  126     Colonoscopy 08/03/2023: - One diminutive polyp in the cecum, removed with a cold snare. Resected and retrieved.  - One 3 mm polyp in the transverse colon, removed with a cold snare. Resected and retrieved.  - Internal hemorrhoids  - Recall colonoscopy 7 years  1. Surgical [P], colon, cecum, polyp (1) :       - POLYPOID COLONIC MUCOSA WITH A PROMINENT LYMPHOID AGGREGATE       - NEGATIVE FOR DYSPLASIA        2. Surgical [P], colon, transverse, polyp (1) :       - TUBULAR ADENOMA       - NEGATIVE FOR HIGH-GRADE DYSPLASIA OR MALIGNANCY   Colonoscopy 02/07/2018: - The examined portion of the ileum was normal.  - One 5 to 6 mm  polyp in the cecum, removed with a cold snare. Resected and retrieved.  - One 5 mm polyp at the hepatic flexure, removed with a cold snare. Resected and retrieved.  - Tortuous colon. - Internal hemorrhoids.  - The examination was otherwise normal.  - Biopsies were taken with a cold forceps from the right colon, left colon and transverse colon for evaluation of microscopic colitis. 1. Surgical [P], cecum, hepatic flexure, polyp (2) - SESSILE SERRATED POLYP (1 OF 2 FRAGMENTS) - BENIGN COLONIC MUCOSA (1 OF 2 FRAGMENTS) - NO HIGH GRADE DYSPLASIA OR MALIGNANCY IDENTIFIED 2. Surgical [P], random sites - BENIGN COLONIC MUCOSA - NO ACTIVE INFLAMMATION OR EVIDENCE OF MICROSCOPIC COLITIS - NO HIGH GRADE DYSPLASIA OR MALIGNANCY IDENTIFIED  Past Medical History:  Diagnosis Date   Allergy  Anal fissure    Anxiety    Arthritis    Asthma    Atypical mycobacterial infection    Cylindrical bronchiectasis (HCC)    Depression    Diarrhea    Fibromyalgia    Heart murmur    HLD (hyperlipidemia)    Joint pain    Osteoporosis    Overweight (BMI 25.0-29.9) 10/29/2021   Rheumatoid arthritis (HCC)    Sleep apnea    on CPAP   Vitamin D  deficiency    Past Surgical History:  Procedure Laterality Date   COLONOSCOPY     LUMBAR DISC SURGERY  2008   plantar fasciitis surgery Right 2000     Current Medications, Allergies, Past Medical History, Past Surgical History, Family History and Social History were reviewed in [employer] record.   Review of Systems:   Constitutional: Negative for fever, sweats, chills or weight loss.  Respiratory: Negative for shortness of breath.   Cardiovascular: Negative for chest pain, palpitations and leg swelling.  Gastrointestinal: See HPI.  Musculoskeletal: Negative for back pain or muscle aches.  Neurological: Negative for dizziness, headaches or paresthesias.    Physical Exam: There were no vitals taken for this visit. General: in no  acute distress. Head: Normocephalic and atraumatic. Eyes: No scleral icterus. Conjunctiva pink . Ears: Normal auditory acuity. Mouth: Dentition intact. No ulcers or lesions.  Lungs: Clear throughout to auscultation. Heart: Regular rate and rhythm, no murmur. Abdomen: Soft, nontender and nondistended. No masses or hepatomegaly. Normal bowel sounds x 4 quadrants.  Rectal: Deferred.  Musculoskeletal: Symmetrical with no gross deformities. Extremities: No edema. Neurological: Alert oriented x 4. No focal deficits.  Psychological: Alert and cooperative. Normal mood and affect  Assessment and Recommendations: ***

## 2024-02-08 ENCOUNTER — Encounter: Payer: Self-pay | Admitting: Nurse Practitioner

## 2024-02-08 ENCOUNTER — Ambulatory Visit: Admitting: Nurse Practitioner

## 2024-02-08 VITALS — BP 106/60 | HR 68 | Ht 62.0 in | Wt 170.6 lb

## 2024-02-08 DIAGNOSIS — K529 Noninfective gastroenteritis and colitis, unspecified: Secondary | ICD-10-CM | POA: Insufficient documentation

## 2024-02-08 DIAGNOSIS — Z860101 Personal history of adenomatous and serrated colon polyps: Secondary | ICD-10-CM

## 2024-02-08 MED ORDER — CHOLESTYRAMINE 4 G PO PACK: 4.0000 g | PACK | Freq: Every day | ORAL | 1 refills | Status: DC

## 2024-02-08 NOTE — Patient Instructions (Addendum)
 We have sent the following medications to your pharmacy for you to pick up at your convenience: Cholestyramine 4g packet daily.  Please mix 1 4g packet in 4oz of clear liquids of your choice once daily. DO NOT take this within 4 hours of any other medication.  Start yogurt of your choice.  Please follow up sooner if symptoms increase or worsen  Due to recent changes in healthcare laws, you may see the results of your imaging and laboratory studies on MyChart before your provider has had a chance to review them.  We understand that in some cases there may be results that are confusing or concerning to you. Not all laboratory results come back in the same time frame and the provider may be waiting for multiple results in order to interpret others.  Please give us  48 hours in order for your provider to thoroughly review all the results before contacting the office for clarification of your results.   Thank you for trusting me with your gastrointestinal care!    Everett Hitt, NP _______________________________________________________  If your blood pressure at your visit was 140/90 or greater, please contact your primary care physician to follow up on this.  _______________________________________________________  If you are age 65 or older, your body mass index should be between 23-30. Your Body mass index is 31.2 kg/m. If this is out of the aforementioned range listed, please consider follow up with your Primary Care Provider.  If you are age 35 or younger, your body mass index should be between 19-25. Your Body mass index is 31.2 kg/m. If this is out of the aformentioned range listed, please consider follow up with your Primary Care Provider.   ________________________________________________________  The [facility] providers would like to encourage you to use MYCHART to communicate with providers for non-urgent requests or questions.  Due to long hold times on the telephone,  sending your provider a message by [facility] may be a faster and more efficient way to get a response.  Please allow 48 business hours for a response.  Please remember that this is for non-urgent requests.  _______________________________________________________

## 2024-02-08 NOTE — Progress Notes (Signed)
 Agree with assessment and plan as outlined.

## 2024-03-01 ENCOUNTER — Other Ambulatory Visit: Payer: Self-pay | Admitting: Nurse Practitioner

## 2024-03-20 ENCOUNTER — Encounter: Payer: 59 | Admitting: Family Medicine

## 2024-04-04 ENCOUNTER — Other Ambulatory Visit: Payer: Self-pay | Admitting: Nurse Practitioner

## 2024-04-18 ENCOUNTER — Encounter: Admitting: Family Medicine

## 2024-04-24 ENCOUNTER — Encounter: Payer: Self-pay | Admitting: Family Medicine

## 2024-04-24 ENCOUNTER — Ambulatory Visit (INDEPENDENT_AMBULATORY_CARE_PROVIDER_SITE_OTHER): Admitting: Family Medicine

## 2024-04-24 ENCOUNTER — Ambulatory Visit: Payer: Self-pay | Admitting: Family Medicine

## 2024-04-24 VITALS — BP 110/64 | HR 73 | Temp 98.0°F | Resp 16 | Ht 62.0 in | Wt 173.2 lb

## 2024-04-24 DIAGNOSIS — Z1159 Encounter for screening for other viral diseases: Secondary | ICD-10-CM

## 2024-04-24 DIAGNOSIS — M25512 Pain in left shoulder: Secondary | ICD-10-CM

## 2024-04-24 DIAGNOSIS — Z0000 Encounter for general adult medical examination without abnormal findings: Secondary | ICD-10-CM

## 2024-04-24 DIAGNOSIS — G8929 Other chronic pain: Secondary | ICD-10-CM

## 2024-04-24 LAB — LIPID PANEL
Cholesterol: 214 mg/dL — ABNORMAL HIGH (ref 0–200)
HDL: 85.5 mg/dL (ref >39.00)
LDL Cholesterol: 112 mg/dL — ABNORMAL HIGH (ref 0–99)
NonHDL: 128.72
Total CHOL/HDL Ratio: 3
Triglycerides: 85 mg/dL (ref 0.0–149.0)
VLDL: 17 mg/dL (ref 0.0–40.0)

## 2024-04-24 LAB — COMPREHENSIVE METABOLIC PANEL WITH GFR
ALT: 15 U/L (ref 0–35)
AST: 19 U/L (ref 0–37)
Albumin: 4.3 g/dL (ref 3.5–5.2)
Alkaline Phosphatase: 74 U/L (ref 39–117)
BUN: 10 mg/dL (ref 6–23)
CO2: 28 meq/L (ref 19–32)
Calcium: 9.1 mg/dL (ref 8.4–10.5)
Chloride: 103 meq/L (ref 96–112)
Creatinine, Ser: 0.82 mg/dL (ref 0.40–1.20)
GFR: 75.38 mL/min (ref >60.00)
Glucose, Bld: 88 mg/dL (ref 70–99)
Potassium: 4.2 meq/L (ref 3.5–5.1)
Sodium: 139 meq/L (ref 135–145)
Total Bilirubin: 0.6 mg/dL (ref 0.2–1.2)
Total Protein: 7 g/dL (ref 6.0–8.3)

## 2024-04-24 LAB — CBC
HCT: 42.9 % (ref 36.0–46.0)
Hemoglobin: 14.1 g/dL (ref 12.0–15.0)
MCHC: 32.8 g/dL (ref 30.0–36.0)
MCV: 80.5 fl (ref 78.0–100.0)
Platelets: 269 K/uL (ref 150.0–400.0)
RBC: 5.33 Mil/uL — ABNORMAL HIGH (ref 3.87–5.11)
RDW: 14.5 % (ref 11.5–15.5)
WBC: 4.8 K/uL (ref 4.0–10.5)

## 2024-04-24 NOTE — Progress Notes (Signed)
 Chief Complaint  Patient presents with   Annual Exam    CPE     Well Woman Karina Wilkinson is here for a complete physical.   Her last physical was >1 year ago.  Current diet: in general, diet could be better. Current exercise: active but not exercising. Weight is stable and she denies fatigue out of ordinary. No LMP recorded. Patient is postmenopausal. Seatbelt? Yes Advanced directive? No  Health Maintenance Pap/HPV- Yes Mammogram- Yes Colon cancer screening-Yes Shingrix- Got the 1st one Tetanus- Yes Hep C screening- No HIV screening- Yes  Over the past 5 months, the patient has had left shoulder pain.  She has a history of brain injury and does not remember any specific change in activity or trauma.  She has not been doing anything at home thus far.  Slight decreased range of motion.  No bruising, redness, swelling, catching or locking.  No neurologic signs or symptoms.  She thinks it is getting worse.  Pain is over the top of the shoulder, difficult to touch.  Past Medical History:  Diagnosis Date   Allergy    Anal fissure    Anxiety    Arthritis    Asthma    Atypical mycobacterial infection    Cylindrical bronchiectasis (HCC)    Depression    Diarrhea    Fibromyalgia    Heart murmur    HLD (hyperlipidemia)    Joint pain    Osteoporosis    Overweight (BMI 25.0-29.9) 10/29/2021   Rheumatoid arthritis (HCC)    Sleep apnea    on CPAP   Vitamin D  deficiency      Past Surgical History:  Procedure Laterality Date   COLONOSCOPY     LUMBAR DISC SURGERY  2008   plantar fasciitis surgery Right 2000    Medications  Current Outpatient Medications on File Prior to Visit  Medication Sig Dispense Refill   buPROPion  (WELLBUTRIN  XL) 150 MG 24 hr tablet Take 1 tablet (150 mg total) by mouth every morning. 90 tablet 3   busPIRone  (BUSPAR ) 10 MG tablet TAKE 1 TABLET BY MOUTH AS NEEDED. 90 tablet 2   cholestyramine  (QUESTRAN ) 4 g packet TAKE 1 PACKET BY MOUTH DAILY. 30  packet 1   citalopram  (CELEXA ) 20 MG tablet Take 3 tablets (60 mg total) by mouth daily. 270 tablet 3   cyclobenzaprine (FLEXERIL) 10 MG tablet Take 10 mg by mouth as needed.     ESTRADIOL VA Place 1 Application vaginally 2 (two) times a week. Compound vaginal cream     fluticasone  (FLONASE ) 50 MCG/ACT nasal spray Place 1 spray into both nostrils daily.     hydroxychloroquine  (PLAQUENIL ) 200 MG tablet Take 200 mg by mouth 2 (two) times daily.     loperamide  (IMODIUM ) 2 MG capsule Take 2 mg by mouth as needed for diarrhea or loose stools.     loratadine (CLARITIN) 10 MG tablet Take 10 mg by mouth daily as needed for allergies.     rosuvastatin  (CRESTOR ) 10 MG tablet Take 1 tablet (10 mg total) by mouth daily. 90 tablet 3   Vitamin D , Ergocalciferol , (DRISDOL) 50000 units CAPS capsule Take 50,000 Units by mouth every 7 (seven) days.     No current facility-administered medications on file prior to visit.     Allergies No Active Allergies  Review of Systems: Constitutional:  no unexpected weight changes Eye:  no recent significant change in vision Ear/Nose/Mouth/Throat:  Ears:  no recent change in hearing Nose/Mouth/Throat:  no  complaints of nasal congestion, no sore throat Cardiovascular: no chest pain Respiratory:  no shortness of breath Gastrointestinal:  no abdominal pain, no change in bowel habits GU:  Female: negative for dysuria or pelvic pain Musculoskeletal/Extremities:  + Left shoulder pain Integumentary (Skin/Breast):  no abnormal skin lesions reported Neurologic:  no headaches Endocrine:  denies fatigue  Exam BP 110/64 (BP Location: Left Arm, Patient Position: Sitting)   Pulse 73   Temp 98 F (36.7 C) (Oral)   Resp 16   Ht 5' 2 (1.575 m)   Wt 173 lb 3.2 oz (78.6 kg)   SpO2 97%   BMI 31.68 kg/m  General:  well developed, well nourished, in no apparent distress Skin: erythematous patch over R anterior shoulder with overlying scale; no significant moles, warts, or  growths Head:  no masses, lesions, or tenderness Eyes:  pupils equal and round, sclera anicteric without injection Ears:  canals without lesions, TMs shiny without retraction, no obvious effusion, no erythema Nose:  nares patent, mucosa normal, and no drainage  Throat/Pharynx:  lips and gingiva without lesion; tongue and uvula midline; non-inflamed pharynx; no exudates or postnasal drainage Neck: neck supple without adenopathy, thyromegaly, or masses Lungs:  clear to auscultation, breath sounds equal bilaterally, no respiratory distress Cardio:  regular rate and rhythm, no LE edema Abdomen:  abdomen soft, nontender; bowel sounds normal; no masses or organomegaly Genital: Defer to GYN Musculoskeletal: Left shoulder: Decreased active forward flexion, normal passive range of motion, positive Neer's, Hawkins, empty can on the left; negative crossover, speeds, liftoff, O'Brien's; no TTP or deformity Extremities:  no clubbing, cyanosis, or edema, no deformities, no skin discoloration Neuro:  gait normal; deep tendon reflexes normal and symmetric Psych: well oriented with normal range of affect and appropriate judgment/insight  Assessment and Plan  Well adult exam - Plan: CBC, Comprehensive metabolic panel with GFR, Lipid panel  Encounter for hepatitis C screening test for low risk patient - Plan: Hepatitis C antibody  Chronic left shoulder pain   Well 63 y.o. female. Counseled on diet and exercise. Advanced directive form provided today.  Screen Hep C.  UTD w cerv cancer screening.  Shingrix 2/2 recommended.  She will plan accordingly for this as the first 1 made her sick. Left shoulder pain: Chronic, not currently controlled.  Suspect supraspinatus involvement.  Stretches and exercises provided.  Tylenol  as needed.  Heat, ice.  If no improvement in the next month, she will come back and we will consider imaging and injection. Other orders as above. Follow up in 6 mo. The patient voiced  understanding and agreement to the plan.  Mabel [place], DO 04/24/24 10:12 AM

## 2024-04-24 NOTE — Patient Instructions (Addendum)
 Give us  2-3 business days to get the results of your labs back.   Keep the diet clean and stay active.  Aim to do some physical exertion for 150 minutes per week. This is typically divided into 5 days per week, 30 minutes per day. The activity should be enough to get your heart rate up. Anything is better than nothing if you have time constraints.  Please get me a copy of your advanced directive form at your convenience.   I expect the lesion on your R shoulder to be significantly better in the next couple weeks. If it is not, please send me a message.  Plan accordingly with your 2nd shingles vaccination.   Follow up in 1 month if no improvement with the stretches.   Heat (pad or rice pillow in microwave) over affected area, 10-15 minutes twice daily.   OK to take Tylenol  1000 mg (2 extra strength tabs) or 975 mg (3 regular strength tabs) every 6 hours as needed.  Let us  know if you need anything.  EXERCISES  RANGE OF MOTION (ROM) AND STRETCHING EXERCISES These exercises may help you when beginning to rehabilitate your injury. While completing these exercises, remember:  Restoring tissue flexibility helps normal motion to return to the joints. This allows healthier, less painful movement and activity. An effective stretch should be held for at least 30 seconds. A stretch should never be painful. You should only feel a gentle lengthening or release in the stretched tissue.  ROM - Pendulum Bend at the waist so that your right / left arm falls away from your body. Support yourself with your opposite hand on a solid surface, such as a table or a countertop. Your right / left arm should be perpendicular to the ground. If it is not perpendicular, you need to lean over farther. Relax the muscles in your right / left arm and shoulder as much as possible. Gently sway your hips and trunk so they move your right / left arm without any use of your right / left shoulder muscles. Progress your  movements so that your right / left arm moves side to side, then forward and backward, and finally, both clockwise and counterclockwise. Complete 10-15 repetitions in each direction. Many people use this exercise to relieve discomfort in their shoulder as well as to gain range of motion. Repeat 2 times. Complete this exercise 3 times per week.  STRETCH - Flexion, Standing Stand with good posture. With an underhand grip on your right / left hand and an overhand grip on the opposite hand, grasp a broomstick or cane so that your hands are a little more than shoulder-width apart. Keeping your right / left elbow straight and shoulder muscles relaxed, push the stick with your opposite hand to raise your right / left arm in front of your body and then overhead. Raise your arm until you feel a stretch in your right / left shoulder, but before you have increased shoulder pain. Try to avoid shrugging your right / left shoulder as your arm rises by keeping your shoulder blade tucked down and toward your mid-back spine. Hold 30 seconds. Slowly return to the starting position. Repeat 2 times. Complete this exercise 3 times per week.  STRETCH - Internal Rotation Place your right / left hand behind your back, palm-up. Throw a towel or belt over your opposite shoulder. Grasp the towel/belt with your right / left hand. While keeping an upright posture, gently pull up on the towel/belt until you  feel a stretch in the front of your right / left shoulder. Avoid shrugging your right / left shoulder as your arm rises by keeping your shoulder blade tucked down and toward your mid-back spine. Hold 30. Release the stretch by lowering your opposite hand. Repeat 2 times. Complete this exercise 3 times per week.  STRETCH - External Rotation and Abduction Stagger your stance through a doorframe. It does not matter which foot is forward. As instructed by your physician, physical therapist or athletic trainer, place your  hands: And forearms above your head and on the door frame. And forearms at head-height and on the door frame. At elbow-height and on the door frame. Keeping your head and chest upright and your stomach muscles tight to prevent over-extending your low-back, slowly shift your weight onto your front foot until you feel a stretch across your chest and/or in the front of your shoulders. Hold 30 seconds. Shift your weight to your back foot to release the stretch. Repeat 2 times. Complete this stretch 3 times per week.   STRENGTHENING EXERCISES  These exercises may help you when beginning to rehabilitate your injury. They may resolve your symptoms with or without further involvement from your physician, physical therapist or athletic trainer. While completing these exercises, remember:  Muscles can gain both the endurance and the strength needed for everyday activities through controlled exercises. Complete these exercises as instructed by your physician, physical therapist or athletic trainer. Progress the resistance and repetitions only as guided. You may experience muscle soreness or fatigue, but the pain or discomfort you are trying to eliminate should never worsen during these exercises. If this pain does worsen, stop and make certain you are following the directions exactly. If the pain is still present after adjustments, discontinue the exercise until you can discuss the trouble with your clinician. If advised by your physician, during your recovery, avoid activity or exercises which involve actions that place your right / left hand or elbow above your head or behind your back or head. These positions stress the tissues which are trying to heal.  STRENGTH - Scapular Depression and Adduction With good posture, sit on a firm chair. Supported your arms in front of you with pillows, arm rests or a table top. Have your elbows in line with the sides of your body. Gently draw your shoulder blades down and  toward your mid-back spine. Gradually increase the tension without tensing the muscles along the top of your shoulders and the back of your neck. Hold for 3 seconds. Slowly release the tension and relax your muscles completely before completing the next repetition. After you have practiced this exercise, remove the arm support and complete it in standing as well as sitting. Repeat 2 times. Complete this exercise 3 times per week.   STRENGTH - External Rotators Secure a rubber exercise band/tubing to a fixed object so that it is at the same height as your right / left elbow when you are standing or sitting on a firm surface. Stand or sit so that the secured exercise band/tubing is at your side that is not injured. Bend your elbow 90 degrees. Place a folded towel or small pillow under your right / left arm so that your elbow is a few inches away from your side. Keeping the tension on the exercise band/tubing, pull it away from your body, as if pivoting on your elbow. Be sure to keep your body steady so that the movement is only coming from your shoulder rotating.  Hold 3 seconds. Release the tension in a controlled manner as you return to the starting position. Repeat 2 times. Complete this exercise 3 times per week.   STRENGTH - Supraspinatus Stand or sit with good posture. Grasp a 2-3 lb weight or an exercise band/tubing so that your hand is thumbs-up, like when you shake hands. Slowly lift your right / left hand from your thigh into the air, traveling about 30 degrees from straight out at your side. Lift your hand to shoulder height or as far as you can without increasing any shoulder pain. Initially, many people do not lift their hands above shoulder height. Avoid shrugging your right / left shoulder as your arm rises by keeping your shoulder blade tucked down and toward your mid-back spine. Hold for 3 seconds. Control the descent of your hand as you slowly return to your starting  position. Repeat 2 times. Complete this exercise 3 times per week.   STRENGTH - Shoulder Extensors Secure a rubber exercise band/tubing so that it is at the height of your shoulders when you are either standing or sitting on a firm arm-less chair. With a thumbs-up grip, grasp an end of the band/tubing in each hand. Straighten your elbows and lift your hands straight in front of you at shoulder height. Step back away from the secured end of band/tubing until it becomes tense. Squeezing your shoulder blades together, pull your hands down to the sides of your thighs. Do not allow your hands to go behind you. Hold for 3 seconds. Slowly ease the tension on the band/tubing as you reverse the directions and return to the starting position. Repeat 2 times. Complete this exercise 3 times per week.   STRENGTH - Scapular Retractors Secure a rubber exercise band/tubing so that it is at the height of your shoulders when you are either standing or sitting on a firm arm-less chair. With a palm-down grip, grasp an end of the band/tubing in each hand. Straighten your elbows and lift your hands straight in front of you at shoulder height. Step back away from the secured end of band/tubing until it becomes tense. Squeezing your shoulder blades together, draw your elbows back as you bend them. Keep your upper arm lifted away from your body throughout the exercise. Hold 3 seconds. Slowly ease the tension on the band/tubing as you reverse the directions and return to the starting position. Repeat 2 times. Complete this exercise 3 times per week.  STRENGTH - Scapular Depressors Find a sturdy chair without wheels, such as a from a dining room table. Keeping your feet on the floor, lift your bottom from the seat and lock your elbows. Keeping your elbows straight, allow gravity to pull your body weight down. Your shoulders will rise toward your ears. Raise your body against gravity by drawing your shoulder blades down  your back, shortening the distance between your shoulders and ears. Although your feet should always maintain contact with the floor, your feet should progressively support less body weight as you get stronger. Hold 3 seconds. In a controlled and slow manner, lower your body weight to begin the next repetition. Repeat 2 times. Complete this exercise 3 times per week.    This information is not intended to replace advice given to you by your health care provider. Make sure you discuss any questions you have with your health care provider.   Document Released: 07/07/2005 Document Revised: 09/13/2014 Document Reviewed: 12/05/2008 Elsevier Interactive Patient Education [employer]

## 2024-04-25 LAB — HEPATITIS C ANTIBODY: Hepatitis C Ab: NONREACTIVE

## 2024-04-26 ENCOUNTER — Ambulatory Visit: Admitting: Adult Health

## 2024-04-26 ENCOUNTER — Encounter: Payer: Self-pay | Admitting: Adult Health

## 2024-04-26 VITALS — BP 110/70 | Ht 62.0 in | Wt 171.8 lb

## 2024-04-26 DIAGNOSIS — G4733 Obstructive sleep apnea (adult) (pediatric): Secondary | ICD-10-CM

## 2024-04-26 DIAGNOSIS — J479 Bronchiectasis, uncomplicated: Secondary | ICD-10-CM | POA: Diagnosis not present

## 2024-04-26 DIAGNOSIS — J4520 Mild intermittent asthma, uncomplicated: Secondary | ICD-10-CM

## 2024-04-26 MED ORDER — ZEPBOUND 2.5 MG/0.5ML ~~LOC~~ SOAJ
2.5000 mg | SUBCUTANEOUS | 0 refills | Status: AC
Start: 2024-04-26 — End: ?

## 2024-04-26 NOTE — Patient Instructions (Addendum)
 Refer to Orthodontics for oral appliance  Begin Zepbound  2.5mg  injection weekly.  Healthy diet as discussed  If develop severe abdominal pain, vomiting , diarrhea or worsening depression please call or seek emergency attention  Activity /exercise as able   Follow up in 4-6 weeks and As needed

## 2024-04-26 NOTE — Progress Notes (Signed)
 @Patient  ID: Karina Wilkinson, female    DOB: 03-19-1961, 63 y.o.   MRN: [id number]  Chief Complaint  Patient presents with   Sleep Apnea    Referring provider: Frann Mabel Mt*  HPI: 63 year old female former smoker followed for obstructive sleep apnea, Asthma and Bronchiectasis  Previous severe pneumonia with COVID-19 infection in January 2023, multifocal pneumonia February 2023 requiring prolonged hospitalization  TEST/EVENTS :  Home Sleep Study 02/16/2022 Moderate Sleep Apnea AHI of 16, SpO2 low of 89%  Imaging: CTA 10/29/2021-bilateral multifocal airspace disease right greater than left.    CT chest in June 2023 widespread bronchiectasis bilaterally with mucoid impaction, negative for ILD   Cardiac: Echocardiogram 10/30/2021 LVEF 60 to 65%, RV systolic size and function is normal.  04/26/2024 Follow up : OSA, Bronchiectasis  Discussed the use of AI scribe software for clinical note transcription with the patient, who gave verbal consent to proceed.  History of Present Illness Karina Wilkinson is a 63 year old female with moderate sleep apnea who presents for evaluation of treatment options.  Last seen in the office November 2023  She has a history of moderate sleep apnea, diagnosed in 2023, with an AHI of 16. She has been using CPAP therapy but finds it inconvenient and has not been consistent with its use. While CPAP was effective, it is cumbersome and affects her sleep position.  Patient does feel better if she wears CPAP.  She is not as tired sleep is improved.  She does feel tired and unrefreshed.  She continues to have trouble losing weight.  Current weight is 171 pounds with a BMI of 31  She is considering weight loss as a potential method to improve her sleep apnea and has previously used Saxenda for weight loss, which was effective until she plateaued.  Has tried many things for weight loss but finds it difficult to lose weight consistently.  She has a history of  rheumatoid arthritis for which she takes Plaquenil . She also experiences gastrointestinal issues, specifically diarrhea, for which she sees a gastroenterologist and is on medication.   In her family history, her daughter has Hashimoto's disease, but there is no family history of thyroid  cancer or endocrine tumors. No personal history of thyroid  cancer, severe gallbladder disease, or pancreatitis.  She is postmenopausal and retired, looking forward to utilizing a free gym membership starting October 1st with her new Medicare plan. She has previously participated in Weight Watchers and found it beneficial, though she has been unable to lose further weight beyond a certain point.  As above patient had prolonged recovery from COVID-19 infection in 2023 complicated by multifocal pneumonia requiring hospitalization.  CT chest June 2023 was negative for ILD but showed bilateral bronchiectasis with mucoid impaction.  Patient says overall her breathing has been doing okay.  No Active Allergies  Immunization History  Administered Date(s) Administered   DTaP 12/07/2021   Influenza, Quadrivalent, Recombinant, Inj, Pf 05/29/2020, 07/27/2021   Influenza,inj,Quad PF,6+ Mos 07/27/2022   Influenza,inj,quad, With Preservative 06/06/2020   Influenza,trivalent, recombinat, inj, PF 06/10/2023   MMR 07/01/1962   PFIZER Comirnaty (Gray Top)Covid-19 Tri-Sucrose Vaccine 01/22/2021   PFIZER(Purple Top)SARS-COV-2 Vaccination 11/10/2019, 12/01/2019, 06/13/2020, 06/10/2023   PNEUMOCOCCAL CONJUGATE-20 07/26/2023   Td 07/01/1966, 07/01/1970   Tdap 12/07/2021   Zoster Recombinant(Shingrix) 09/25/2020    Past Medical History:  Diagnosis Date   Allergy    Anal fissure    Anxiety    Arthritis    Asthma    Atypical mycobacterial infection  Cylindrical bronchiectasis (HCC)    Depression    Diarrhea    Fibromyalgia    Heart murmur    HLD (hyperlipidemia)    Joint pain    Osteoporosis    Overweight (BMI  25.0-29.9) 10/29/2021   Rheumatoid arthritis (HCC)    Sleep apnea    on CPAP   Vitamin D  deficiency     Tobacco History: Social History   Tobacco Use  Smoking Status Former   Current packs/day: 0.00   Types: Cigarettes   Quit date: 1985   Years since quitting: 40.6  Smokeless Tobacco Never   Counseling given: Not Answered   Outpatient Medications Prior to Visit  Medication Sig Dispense Refill   buPROPion  (WELLBUTRIN  XL) 150 MG 24 hr tablet Take 1 tablet (150 mg total) by mouth every morning. 90 tablet 3   busPIRone  (BUSPAR ) 10 MG tablet TAKE 1 TABLET BY MOUTH AS NEEDED. 90 tablet 2   cholestyramine  (QUESTRAN ) 4 g packet TAKE 1 PACKET BY MOUTH DAILY. 30 packet 1   citalopram  (CELEXA ) 20 MG tablet Take 3 tablets (60 mg total) by mouth daily. 270 tablet 3   cyclobenzaprine (FLEXERIL) 10 MG tablet Take 10 mg by mouth as needed.     ESTRADIOL VA Place 1 Application vaginally 2 (two) times a week. Compound vaginal cream     fluticasone  (FLONASE ) 50 MCG/ACT nasal spray Place 1 spray into both nostrils daily.     hydroxychloroquine  (PLAQUENIL ) 200 MG tablet Take 200 mg by mouth 2 (two) times daily.     loperamide  (IMODIUM ) 2 MG capsule Take 2 mg by mouth as needed for diarrhea or loose stools.     loratadine (CLARITIN) 10 MG tablet Take 10 mg by mouth daily as needed for allergies.     rosuvastatin  (CRESTOR ) 10 MG tablet Take 1 tablet (10 mg total) by mouth daily. 90 tablet 3   Vitamin D , Ergocalciferol , (DRISDOL) 50000 units CAPS capsule Take 50,000 Units by mouth every 7 (seven) days.     No facility-administered medications prior to visit.     Review of Systems:   Constitutional:   No  weight loss, night sweats,  Fevers, chills, +fatigue, or  lassitude.  HEENT:   No headaches,  Difficulty swallowing,  Tooth/dental problems, or  Sore throat,                No sneezing, itching, ear ache, nasal congestion, post nasal drip,   CV:  No chest pain,  Orthopnea, PND, swelling in  lower extremities, anasarca, dizziness, palpitations, syncope.   GI  No heartburn, indigestion, abdominal pain, nausea, vomiting, diarrhea, change in bowel habits, loss of appetite, bloody stools.   Resp: No shortness of breath with exertion or at rest.  No excess mucus, no productive cough,  No non-productive cough,  No coughing up of blood.  No change in color of mucus.  No wheezing.  No chest wall deformity  Skin: no rash or lesions.  GU: no dysuria, change in color of urine, no urgency or frequency.  No flank pain, no hematuria   MS:  No joint pain or swelling.  No decreased range of motion.  No back pain.    Physical Exam  BP 110/70   Ht 5' 2 (1.575 m)   Wt 171 lb 12.8 oz (77.9 kg)   SpO2 98% Comment: RA  BMI 31.42 kg/m   GEN: A/Ox3; pleasant , NAD, well nourished    HEENT:  [place]/AT,   NOSE-clear, THROAT-clear, no  lesions, no postnasal drip or exudate noted.   NECK:  Supple w/ fair ROM; no JVD; normal carotid impulses w/o bruits; no thyromegaly or nodules palpated; no lymphadenopathy.    RESP  Clear  P & A; w/o, wheezes/ rales/ or rhonchi. no accessory muscle use, no dullness to percussion  CARD:  RRR, no m/r/g, no peripheral edema, pulses intact, no cyanosis or clubbing.  GI:   Soft & nt; nml bowel sounds; no organomegaly or masses detected.   Musco: Warm bil, no deformities or joint swelling noted.   Neuro: alert, no focal deficits noted.    Skin: Warm, no lesions or rashes    Lab Results:  CBC  BMET ProBNP No results found for: PROBNP  Imaging: No results found.  Administration History     None           No data to display          No results found for: NITRICOXIDE      Assessment & Plan:   No problem-specific Assessment & Plan notes found for this encounter.  Assessment and Plan Assessment & Plan Obstructive sleep apnea, moderate   Moderate obstructive sleep apnea with an AHI of 16. Previous CPAP use was effective but  inconvenient. Discussed alternative treatments, including a dental device and Zepbound  for weight loss. Inspire device.  Patient education on mandibular advancement device and Zepbound , a GLP-1 receptor agonist, can aid in weight loss, potentially improving sleep apnea. Potential side effects include nausea, constipation, diarrhea, and mood changes. Emphasized the importance of treating sleep apnea to prevent complications such as heart failure, arrhythmias,  Discussed the Inspire device as a surgical option -Refer to an orthodontist for evaluation of a dental device. Initiate Zepbound  for weight loss and discuss potential side effects. Advise on the importance of treating sleep apnea to prevent complications.  - discussed how weight can impact sleep and risk for sleep disordered breathing - discussed options to assist with weight loss: combination of diet modification, cardiovascular and strength training exercises   - had an extensive discussion regarding the adverse health consequences related to untreated sleep disordered breathing - specifically discussed the risks for hypertension, coronary artery disease, cardiac dysrhythmias, cerebrovascular disease, and diabetes - lifestyle modification discussed   - discussed how sleep disruption can increase risk of accidents, particularly when driving - safe driving practices were discussed   Patient has moderate sleep apnea related to obesity with BMI >/30 posing significant cardiovascular risks. Patient has failed traditional weight loss measures with diet and exercise for >/6 months. Patient will be initiated on Zepbound  (tirzepatide ) for weight management. Zepbound  is the only pharmaceutical treatment approved for moderate-to-severe OSA in adults who are overweight (BMI >/27) or obese (BMI >/30). The patient will continue lifestyle modifications, including structured nutrition and physical activity as directed. No other GLP1 therapy will be used  simultaneously at this time. The patient does not have any FDA labeled contraindications to this agent, including pregnancy, lactation, hx or family history of medullary thyroid  cancer, or multiple endocrine neoplasia type II. Side effect profile has been reviewed with patient. Aware of red flag symptoms to notify of immediately or seek emergency care, including severe nausea/vomiting, inability to pass bowels or gas, severe abdominal pain/tenderness, jaundice.    Obesity in the context of obstructive sleep apnea   Obesity with a BMI over 30 with underlying moderate obstructive sleep apnea. Discussed the use of Zepbound  for weight loss, as she meets the criteria with a BMI over  30 and moderate sleep apnea. Emphasized a comprehensive approach to weight loss, including dietary changes and physical activity, to prevent muscle mass loss and improve overall health. Discussed potential insurance coverage challenges and the importance of checking with her insurance provider regarding Zepbound  coverage. Initiate Zepbound  for weight loss and provide information on its administration. Advise on lifestyle modifications, including a balanced diet and regular exercise. Schedule follow-up in 4-6 weeks to monitor progress and adjust treatment as needed. Recent lab work was reviewed with normal LFTs and renal function Lipid panel with elevated total cholesterol and LDL  Hyperlipidemia advised on a healthy low-cholesterol diet   Bronchiectasis-history of a severe multifocal pneumonia 2023 after COVID-19.  On return visit patient needs a follow-up chest x-ray.  Currently asymptomatic.  No previous PFTs on file.  Plan  Patient Instructions  Refer to Orthodontics for oral appliance  Begin Zepbound  2.5mg  injection weekly.  Healthy diet as discussed  If develop severe abdominal pain, vomiting , diarrhea or worsening depression please call or seek emergency attention  Activity /exercise as able   Follow up in 4-6  weeks and As needed     Madelin Stank, NP 04/26/2024

## 2024-04-30 DIAGNOSIS — G4733 Obstructive sleep apnea (adult) (pediatric): Secondary | ICD-10-CM

## 2024-04-30 NOTE — Telephone Encounter (Signed)
**Note De-identified  [place] Obfuscation** Please advise 

## 2024-05-08 ENCOUNTER — Other Ambulatory Visit: Payer: Self-pay | Admitting: Nurse Practitioner

## 2024-06-04 ENCOUNTER — Telehealth: Payer: Self-pay

## 2024-06-04 NOTE — Telephone Encounter (Signed)
 Copied from CRM #[id number]. Topic: Clinical - Medical Advice >> Jun 04, 2024 12:34 PM Lauren C wrote: Reason for CRM: Pt wants to know what kind of vaccines she should be getting. She is interested in getting a covid vaccine, but is unsure which brand is best. She would like a call back regarding this and would be interested in getting a covid vaccine prescription for whatever brand the rn thinks is best. She was around someone with an active covid infection. Please give her a call at [phone]

## 2024-06-05 NOTE — Telephone Encounter (Signed)
 Yearly flu shots. Did she ever get her 2nd Shingrix?

## 2024-06-05 NOTE — Telephone Encounter (Signed)
 Sent pt message about flu shot and shingles vaccine. Whatever Covid shot the pharmacy has.

## 2024-06-20 DIAGNOSIS — K080 Exfoliation of teeth due to systemic causes: Secondary | ICD-10-CM | POA: Diagnosis not present

## 2024-06-21 ENCOUNTER — Telehealth: Payer: Self-pay

## 2024-06-21 ENCOUNTER — Ambulatory Visit: Admitting: Adult Health

## 2024-06-21 NOTE — Telephone Encounter (Signed)
 Copied from CRM [id number]. Topic: Clinical - Lab/Test Results >> Jun 21, 2024 11:30 AM Isabell A wrote: Reason for CRM: Patient is requesting a copy of her 2023 sleep study results - would like to pick up in office. Please call when ready [phone]   I have called patient,left copy at front desk.NFN

## 2024-06-26 ENCOUNTER — Other Ambulatory Visit: Payer: Self-pay | Admitting: Family Medicine

## 2024-06-26 DIAGNOSIS — F411 Generalized anxiety disorder: Secondary | ICD-10-CM

## 2024-07-13 DIAGNOSIS — M25362 Other instability, left knee: Secondary | ICD-10-CM | POA: Diagnosis not present

## 2024-07-20 DIAGNOSIS — M7552 Bursitis of left shoulder: Secondary | ICD-10-CM | POA: Diagnosis not present

## 2024-07-20 DIAGNOSIS — M19012 Primary osteoarthritis, left shoulder: Secondary | ICD-10-CM | POA: Diagnosis not present

## 2024-08-06 ENCOUNTER — Ambulatory Visit: Admitting: Adult Health

## 2024-08-06 ENCOUNTER — Encounter: Payer: Self-pay | Admitting: Adult Health

## 2024-08-06 ENCOUNTER — Ambulatory Visit

## 2024-08-06 VITALS — BP 114/64 | HR 72 | Temp 99.0°F | Ht 62.5 in | Wt 176.6 lb

## 2024-08-06 DIAGNOSIS — J479 Bronchiectasis, uncomplicated: Secondary | ICD-10-CM | POA: Diagnosis not present

## 2024-08-06 DIAGNOSIS — M25562 Pain in left knee: Secondary | ICD-10-CM | POA: Diagnosis not present

## 2024-08-06 DIAGNOSIS — G4733 Obstructive sleep apnea (adult) (pediatric): Secondary | ICD-10-CM | POA: Diagnosis not present

## 2024-08-06 DIAGNOSIS — E669 Obesity, unspecified: Secondary | ICD-10-CM | POA: Diagnosis not present

## 2024-08-06 DIAGNOSIS — K529 Noninfective gastroenteritis and colitis, unspecified: Secondary | ICD-10-CM | POA: Diagnosis not present

## 2024-08-06 NOTE — Progress Notes (Signed)
 @Patient  ID: Karina Wilkinson, female    DOB: 03-Nov-1960, 63 y.o.   MRN: [id number]  Chief Complaint  Patient presents with   Obstructive Sleep Apnea   Asthma    Referring provider: Frann Mabel Mt*  HPI: 63 yo female former smoker followed for OSA, Asthma and Bronchiectasis  Previous severe pneumonia with COVID-19 infection in January 2023 complicated by multifocal pneumonia February 2023 requiring prolonged hospitalization History of rheumatoid arthritis followed by rheumatology on Plaquenil     TEST/EVENTS : Reviewed 08/06/2024  Home Sleep Study 02/16/2022 Moderate Sleep Apnea AHI of 16, SpO2 low of 89%   Imaging: CTA 10/29/2021-bilateral multifocal airspace disease right greater than left.     CT chest in June 2023 widespread bronchiectasis bilaterally with mucoid impaction, negative for ILD    Cardiac: Echocardiogram 10/30/2021 LVEF 60 to 65%, RV systolic size and function is normal.   Discussed the use of AI scribe software for clinical note transcription with the patient, who gave verbal consent to proceed.  History of Present Illness Karina Wilkinson is a 63 year old female who presents for a follow-up on Zepbound  prescription and sleep apnea management.  Patient has moderate obstructive sleep apnea diagnosed in 2023.  She is CPAP intolerant.  She has ongoing symptoms of restless sleep and daytime sleepiness.  She is following up on her sleep apnea management. She has been referred to orthodontics for an oral appliance but has not yet followed up with them. She has provided her sleep study to her dentist, who can also provide the appliance. She recently started Medicare and is considering cost options for the appliance.  She would like to discuss in further detail starting on Zepbound .  Last visit we discussed starting Zepbound  but unfortunately insurance does not cover and it is cost prohibitive.  She has no history of thyroid  cancer or family history of thyroid  cancer.   She has no history of severe gallbladder disease or pancreatitis.  She does have a history of chronic diarrhea, which is currently well-controlled with a combination of medications, including a Questran   and an over-the-counter enzyme powder called FODzyme. She has no history of inflammatory bowel disease, Crohn's disease, or ulcerative colitis.  She has a history of bronchiectasis, diagnosed after a hospitalization for pneumonia in 2023. She has a history of smoking many years ago but is not currently experiencing respiratory symptoms such as coughing or shortness of breath. She has a rescue inhaler but rarely uses it.  No previous PFTs, she denies any shortness of breath. 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  She does have a history of mild intermittent asthma.  Has an albuterol  inhaler but does not use it.  She is on Plaquenil  and is under the care of a rheumatologist. She undergoes routine blood work every six months to monitor her condition. She has no personal or family history of thyroid  cancer, multiple endocrine neoplasia, severe liver disease, or pancreatitis. Her recent lab work, including liver function and blood count, was normal.  She is not currently very active but has a gym membership and plans to increase her physical activity. She is scheduled for an MRI on her knee soon, which may impact her ability to exercise.    No Active Allergies  Immunization History  Administered Date(s) Administered   DTaP 12/07/2021   Influenza, Quadrivalent, Recombinant, Inj, Pf 05/29/2020,  07/27/2021   Influenza,inj,Quad PF,6+ Mos 07/27/2022   Influenza,inj,quad, With Preservative 06/06/2020   Influenza,trivalent, recombinat, inj, PF 06/10/2023   MMR 07[birth year]01/1960   PFIZER Comirnaty (Gray Top)Covid-19 Tri-Sucrose Vaccine 01/22/2021    PFIZER(Purple Top)SARS-COV-2 Vaccination 11/10/2019, 12/01/2019, 06/13/2020, 06/10/2023   PNEUMOCOCCAL CONJUGATE-20 07/26/2023   Td 07/01/1966, 07/01/1970   Tdap 12/07/2021   Zoster Recombinant(Shingrix) 09/25/2020    Past Medical History:  Diagnosis Date   Allergy    Anal fissure    Anxiety    Arthritis    Asthma    Atypical mycobacterial infection    Cylindrical bronchiectasis (HCC)    Depression    Diarrhea    Fibromyalgia    Heart murmur    HLD (hyperlipidemia)    Joint pain    Osteoporosis    Overweight (BMI 25.0-29.9) 10/29/2021   Rheumatoid arthritis (HCC)    Sleep apnea    on CPAP   Vitamin D  deficiency     Tobacco History: Social History   Tobacco Use  Smoking Status Former   Current packs/day: 0.00   Types: Cigarettes   Quit date: 1985   Years since quitting: 40.9  Smokeless Tobacco Never   Counseling given: Not Answered   Outpatient Medications Prior to Visit  Medication Sig Dispense Refill   buPROPion  (WELLBUTRIN  XL) 150 MG 24 hr tablet Take 1 tablet (150 mg total) by mouth every morning. 90 tablet 3   busPIRone  (BUSPAR ) 10 MG tablet TAKE 1 TABLET BY MOUTH AS NEEDED. 90 tablet 2   cholestyramine  (QUESTRAN ) 4 g packet MIX 1 PACKET IN LIQUID AND DRINK ONCE DAILY (DO NOT TAKE WITHIN 4 HOURS OF ANY OTHER MEDICATION) 90 packet 1   citalopram  (CELEXA ) 20 MG tablet Take 3 tablets (60 mg total) by mouth daily. 270 tablet 1   cyclobenzaprine (FLEXERIL) 10 MG tablet Take 10 mg by mouth as needed.     ESTRADIOL VA Place 1 Application vaginally 2 (two) times a week. Compound vaginal cream     fluticasone  (FLONASE ) 50 MCG/ACT nasal spray Place 1 spray into both nostrils daily.     hydroxychloroquine  (PLAQUENIL ) 200 MG tablet Take 200 mg by mouth 2 (two) times daily.     loperamide  (IMODIUM ) 2 MG capsule Take 2 mg by mouth as needed for diarrhea or loose stools.     loratadine (CLARITIN) 10 MG tablet Take 10 mg by mouth daily as needed for allergies.     OVER THE  COUNTER MEDICATION Take 1 Scoop by mouth as needed.     rosuvastatin  (CRESTOR ) 10 MG tablet Take 1 tablet (10 mg total) by mouth daily. 90 tablet 3   Vitamin D , Ergocalciferol , (DRISDOL) 50000 units CAPS capsule Take 50,000 Units by mouth every 7 (seven) days.     tirzepatide  (ZEPBOUND ) 2.5 MG/0.5ML Pen Inject 2.5 mg into the skin once a week. (Patient not taking: Reported on 08/06/2024) 2 mL 0   No facility-administered medications prior to visit.     Review of Systems:   Constitutional:   No  weight loss, night sweats,  Fevers, chills,+ fatigue, or  lassitude.  HEENT:   No headaches,  Difficulty swallowing,  Tooth/dental problems, or  Sore throat,                No sneezing, itching, ear ache, nasal congestion, post nasal drip,   CV:  No chest pain,  Orthopnea, PND, swelling in lower extremities, anasarca, dizziness, palpitations, syncope.   GI  No heartburn, indigestion, abdominal pain, nausea, vomiting, diarrhea,  change in bowel habits, loss of appetite, bloody stools.   Resp: No shortness of breath with exertion or at rest.  No excess mucus, no productive cough,  No non-productive cough,  No coughing up of blood.  No change in color of mucus.  No wheezing.  No chest wall deformity  Skin: no rash or lesions.  GU: no dysuria, change in color of urine, no urgency or frequency.  No flank pain, no hematuria   MS:  No joint pain or swelling.  No decreased range of motion.  No back pain.    Physical Exam  BP 114/64   Pulse 72   Temp 99 F (37.2 C)   Ht 5' 2.5 (1.588 m) Comment: Per pt  Wt 176 lb 9.6 oz (80.1 kg)   SpO2 95% Comment: RA  BMI 31.79 kg/m   GEN: A/Ox3; pleasant , NAD, well nourished    HEENT:  [place]/AT,   NOSE-clear, THROAT-clear, no lesions, no postnasal drip or exudate noted.   NECK:  Supple w/ fair ROM; no JVD; normal carotid impulses w/o bruits; no thyromegaly or nodules palpated; no lymphadenopathy.    RESP  Clear  P & A; w/o, wheezes/ rales/ or rhonchi. no  accessory muscle use, no dullness to percussion  CARD:  RRR, no m/r/g, no peripheral edema, pulses intact, no cyanosis or clubbing.  GI:   Soft & nt; nml bowel sounds; no organomegaly or masses detected.   Musco: Warm bil, no deformities or joint swelling noted.   Neuro: alert, no focal deficits noted.    Skin: Warm, no lesions or rashes    Lab Results:Reviewed 08/06/2024   CBC   BMET   BNP   ProBNP No results found for: PROBNP  Imaging: No results found.  Administration History     None           No data to display          No results found for: NITRICOXIDE      No data to display              Assessment & Plan:   Assessment and Plan Assessment & Plan Obstructive sleep apnea  -moderate obstructive sleep apnea CPAP intolerant.  Discussed using an oral appliance for management, with concerns about cost and insurance coverage.  Referral to orthodontics sent. weight loss could benefit sleep apnea symptoms Follow up with orthodontist and dentist regarding the oral appliance, considering cost comparison. Research potential benefits and costs.  We discussed Zepbound  as an approved therapy for moderate obstructive sleep apnea with a BMI greater than 30.  Patient is interested in starting this but will check with her gastroenterologist prior to starting.  Also will look at insurance coverage after September 06, 2024 to see if more affordable.  Bronchiectasis -previous severe pneumonia 2023.  Clinically has no significant symptoms. Likely secondary to previous pneumonia, with no current symptoms like cough or shortness of breath. Previous smoking history noted. Further imaging may be needed if symptoms develop. A chest x-ray is ordered to evaluate bronchiectasis.   Chronic diarrhea  -well-controlled on maintenance regimen Well-controlled with current medications and dietary modifications. Discussed potential side effects of Zepbound , including nausea,  vomiting, diarrhea, and constipation. Advised to consult with a gastroenterologist before starting Zepbound . Monitor for worsening GI symptoms if initiated and ensure adequate hydration to prevent dehydration and kidney issues.  Overweight  -BMI 31.  Patient has had difficulty despite multiple diets in the past. Discussed potential use of  Zepbound  for weight loss, with concerns about insurance coverage and side effects, including GI issues. Advised to wait until insurance changes in January to start Zepbound . Emphasized the importance of a healthy diet and exercise in conjunction with medication.  Healthy diet and exercise discussed         Quetzalli Clos, NP 08/06/2024  I spent  34 minutes dedicated to the care of this patient on the date of this encounter to include pre-visit review of records, face-to-face time with the patient discussing conditions above, post visit ordering of testing, clinical documentation with the electronic health record, making appropriate referrals as documented, and communicating necessary findings to members of the patients care team.

## 2024-08-06 NOTE — Patient Instructions (Addendum)
 See Orthodontics for oral appliance-Dr. Micky [phone]  Chest xray today .   Call back if you would like to begin Zepbound  2.5mg  injection weekly in January. Can check on sample if available.  Healthy diet as discussed  Activity /exercise as able  Follow up 2-3 months  and As needed     Notify your provider if you are planning to have a procedure/surgery, as this medication will need to be stopped prior.

## 2024-08-11 ENCOUNTER — Ambulatory Visit: Payer: Self-pay | Admitting: Adult Health

## 2024-08-20 DIAGNOSIS — M0579 Rheumatoid arthritis with rheumatoid factor of multiple sites without organ or systems involvement: Secondary | ICD-10-CM | POA: Diagnosis not present

## 2024-08-20 DIAGNOSIS — M797 Fibromyalgia: Secondary | ICD-10-CM | POA: Diagnosis not present

## 2024-08-20 DIAGNOSIS — M25561 Pain in right knee: Secondary | ICD-10-CM | POA: Diagnosis not present

## 2024-08-20 DIAGNOSIS — Z79899 Other long term (current) drug therapy: Secondary | ICD-10-CM | POA: Diagnosis not present

## 2024-09-28 ENCOUNTER — Other Ambulatory Visit: Payer: Self-pay | Admitting: Family Medicine

## 2024-09-28 DIAGNOSIS — E7800 Pure hypercholesterolemia, unspecified: Secondary | ICD-10-CM

## 2024-10-06 ENCOUNTER — Other Ambulatory Visit: Payer: Self-pay | Admitting: Family Medicine

## 2024-10-06 DIAGNOSIS — F411 Generalized anxiety disorder: Secondary | ICD-10-CM

## 2024-10-26 ENCOUNTER — Ambulatory Visit: Admitting: Family Medicine

## 2024-11-05 ENCOUNTER — Ambulatory Visit: Admitting: Adult Health
